# Patient Record
Sex: Male | Born: 1985 | Race: Black or African American | Hispanic: No | Marital: Single | Smoking: Current every day smoker
Health system: Southern US, Community
[De-identification: ages and names within clinical notes are randomized; demographics above are authoritative.]

## PROBLEM LIST (undated history)

## (undated) DIAGNOSIS — G43909 Migraine, unspecified, not intractable, without status migrainosus: Secondary | ICD-10-CM

## (undated) HISTORY — DX: Migraine, unspecified, not intractable, without status migrainosus: G43.909

## (undated) HISTORY — PX: OTHER SURGICAL HISTORY: SHX169

---
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
## 2006-05-11 ENCOUNTER — Emergency Department (HOSPITAL_COMMUNITY): Admission: EM | Admit: 2006-05-11 | Discharge: 2006-05-12 | Payer: Self-pay | Admitting: Emergency Medicine

## 2008-04-29 ENCOUNTER — Ambulatory Visit: Payer: Self-pay | Admitting: Nurse Practitioner

## 2008-04-29 DIAGNOSIS — R519 Headache, unspecified: Secondary | ICD-10-CM | POA: Insufficient documentation

## 2008-04-29 DIAGNOSIS — H547 Unspecified visual loss: Secondary | ICD-10-CM

## 2008-04-29 DIAGNOSIS — R51 Headache: Secondary | ICD-10-CM

## 2008-04-29 LAB — CONVERTED CEMR LAB
ALT: 12 units/L (ref 0–53)
Albumin: 4.8 g/dL (ref 3.5–5.2)
Alkaline Phosphatase: 75 units/L (ref 39–117)
BUN: 15 mg/dL (ref 6–23)
Basophils Relative: 1 % (ref 0–1)
CO2: 26 meq/L (ref 19–32)
Chloride: 104 meq/L (ref 96–112)
Eosinophils Absolute: 0.1 10*3/uL (ref 0.0–0.7)
Eosinophils Relative: 1 % (ref 0–5)
Lymphs Abs: 2.1 10*3/uL (ref 0.7–4.0)
MCHC: 35.4 g/dL (ref 30.0–36.0)
Monocytes Absolute: 0.7 10*3/uL (ref 0.1–1.0)
Monocytes Relative: 13 % — ABNORMAL HIGH (ref 3–12)
Platelets: 139 10*3/uL — ABNORMAL LOW (ref 150–400)
Potassium: 4.8 meq/L (ref 3.5–5.3)
RDW: 14 % (ref 11.5–15.5)
Sodium: 140 meq/L (ref 135–145)
TSH: 1.155 microintl units/mL (ref 0.350–4.50)
Total Bilirubin: 0.5 mg/dL (ref 0.3–1.2)
Total Protein: 7.6 g/dL (ref 6.0–8.3)
WBC: 5.6 10*3/uL (ref 4.0–10.5)

## 2008-05-07 ENCOUNTER — Ambulatory Visit (HOSPITAL_COMMUNITY): Admission: RE | Admit: 2008-05-07 | Discharge: 2008-05-07 | Payer: Self-pay | Admitting: Family Medicine

## 2008-05-07 ENCOUNTER — Encounter (INDEPENDENT_AMBULATORY_CARE_PROVIDER_SITE_OTHER): Payer: Self-pay | Admitting: Nurse Practitioner

## 2008-05-13 ENCOUNTER — Ambulatory Visit: Payer: Self-pay | Admitting: Nurse Practitioner

## 2008-05-13 LAB — CONVERTED CEMR LAB
Folate: 12.3 ng/mL
Vitamin B-12: 390 pg/mL (ref 211–911)

## 2008-05-14 ENCOUNTER — Encounter (INDEPENDENT_AMBULATORY_CARE_PROVIDER_SITE_OTHER): Payer: Self-pay | Admitting: Nurse Practitioner

## 2008-05-15 ENCOUNTER — Telehealth (INDEPENDENT_AMBULATORY_CARE_PROVIDER_SITE_OTHER): Payer: Self-pay | Admitting: Nurse Practitioner

## 2008-05-15 ENCOUNTER — Encounter (INDEPENDENT_AMBULATORY_CARE_PROVIDER_SITE_OTHER): Payer: Self-pay | Admitting: Nurse Practitioner

## 2008-05-15 DIAGNOSIS — D696 Thrombocytopenia, unspecified: Secondary | ICD-10-CM

## 2008-05-19 ENCOUNTER — Ambulatory Visit: Payer: Self-pay | Admitting: Nurse Practitioner

## 2008-05-21 ENCOUNTER — Encounter (INDEPENDENT_AMBULATORY_CARE_PROVIDER_SITE_OTHER): Payer: Self-pay | Admitting: Nurse Practitioner

## 2008-05-22 LAB — CONVERTED CEMR LAB: GGT: 24 units/L (ref 7–51)

## 2009-09-23 ENCOUNTER — Ambulatory Visit: Payer: Self-pay | Admitting: Nurse Practitioner

## 2009-09-23 DIAGNOSIS — L42 Pityriasis rosea: Secondary | ICD-10-CM | POA: Insufficient documentation

## 2010-01-15 ENCOUNTER — Emergency Department (HOSPITAL_COMMUNITY): Admission: EM | Admit: 2010-01-15 | Discharge: 2010-01-15 | Payer: Self-pay | Admitting: Emergency Medicine

## 2010-01-19 ENCOUNTER — Emergency Department (HOSPITAL_COMMUNITY): Admission: EM | Admit: 2010-01-19 | Discharge: 2010-01-19 | Payer: Self-pay | Admitting: Family Medicine

## 2010-06-10 ENCOUNTER — Emergency Department (HOSPITAL_COMMUNITY): Admission: EM | Admit: 2010-06-10 | Discharge: 2010-06-10 | Payer: Self-pay | Admitting: Family Medicine

## 2010-06-10 ENCOUNTER — Emergency Department (HOSPITAL_COMMUNITY): Admission: EM | Admit: 2010-06-10 | Discharge: 2010-06-10 | Payer: Self-pay | Admitting: Emergency Medicine

## 2010-08-31 ENCOUNTER — Emergency Department (HOSPITAL_COMMUNITY): Admission: EM | Admit: 2010-08-31 | Discharge: 2010-08-31 | Payer: Self-pay | Admitting: Family Medicine

## 2010-09-07 ENCOUNTER — Emergency Department (HOSPITAL_COMMUNITY): Admission: EM | Admit: 2010-09-07 | Discharge: 2010-09-07 | Payer: Self-pay | Admitting: Emergency Medicine

## 2010-11-15 ENCOUNTER — Encounter: Payer: Self-pay | Admitting: Family Medicine

## 2011-01-07 LAB — POCT I-STAT, CHEM 8
BUN: 14 mg/dL (ref 6–23)
Calcium, Ion: 1.15 mmol/L (ref 1.12–1.32)
Chloride: 101 mEq/L (ref 96–112)
Creatinine, Ser: 1.1 mg/dL (ref 0.4–1.5)
Glucose, Bld: 93 mg/dL (ref 70–99)
HCT: 52 % (ref 39.0–52.0)
Hemoglobin: 17.7 g/dL — ABNORMAL HIGH (ref 13.0–17.0)
Sodium: 137 mEq/L (ref 135–145)

## 2011-01-07 LAB — COMPREHENSIVE METABOLIC PANEL
AST: 31 U/L (ref 0–37)
Alkaline Phosphatase: 77 U/L (ref 39–117)
CO2: 25 mEq/L (ref 19–32)
Creatinine, Ser: 0.85 mg/dL (ref 0.4–1.5)
GFR calc Af Amer: >60 mL/min (ref >60)
GFR calc non Af Amer: >60 mL/min (ref >60)
Total Bilirubin: 1.1 mg/dL (ref 0.3–1.2)

## 2011-01-07 LAB — POCT CARDIAC MARKERS
CKMB, poc: 1.1 ng/mL (ref 1.0–8.0)
CKMB, poc: <1 ng/mL — ABNORMAL LOW (ref 1.0–8.0)
Myoglobin, poc: 52.6 ng/mL (ref 12–200)
Troponin i, poc: <0.05 ng/mL (ref 0.00–0.09)

## 2011-01-07 LAB — URINALYSIS, ROUTINE W REFLEX MICROSCOPIC
Bilirubin Urine: NEGATIVE
Nitrite: NEGATIVE
Protein, ur: NEGATIVE mg/dL
Specific Gravity, Urine: 1.019 (ref 1.005–1.030)

## 2011-01-07 LAB — CBC: WBC: 5.2 10*3/uL (ref 4.0–10.5)

## 2011-01-07 LAB — RAPID URINE DRUG SCREEN, HOSP PERFORMED
Amphetamines: NOT DETECTED
Barbiturates: NOT DETECTED
Cocaine: NOT DETECTED

## 2012-05-21 ENCOUNTER — Emergency Department (HOSPITAL_BASED_OUTPATIENT_CLINIC_OR_DEPARTMENT_OTHER)
Admission: EM | Admit: 2012-05-21 | Discharge: 2012-05-22 | Disposition: A | Discharge status: Home / Self Care | Payer: Self-pay | Attending: Emergency Medicine | Admitting: Emergency Medicine

## 2012-05-21 ENCOUNTER — Encounter (HOSPITAL_BASED_OUTPATIENT_CLINIC_OR_DEPARTMENT_OTHER): Payer: Self-pay | Admitting: Emergency Medicine

## 2012-05-21 DIAGNOSIS — H6690 Otitis media, unspecified, unspecified ear: Secondary | ICD-10-CM | POA: Insufficient documentation

## 2012-05-21 DIAGNOSIS — F17200 Nicotine dependence, unspecified, uncomplicated: Secondary | ICD-10-CM | POA: Insufficient documentation

## 2012-05-21 MED ORDER — AMOXICILLIN 500 MG PO CAPS: 500.0000 mg | ORAL_CAPSULE | Freq: Three times a day (TID) | ORAL | Status: AC

## 2012-05-21 MED ORDER — IBUPROFEN 800 MG PO TABS
800.0000 mg | ORAL_TABLET | Freq: Once | ORAL | Status: AC
  Administered 2012-05-21: 800 mg via ORAL
  Filled 2012-05-21: qty 1

## 2012-05-21 MED ORDER — IBUPROFEN 800 MG PO TABS: 800.0000 mg | ORAL_TABLET | Freq: Three times a day (TID) | ORAL | Status: AC

## 2012-05-21 NOTE — ED Notes (Signed)
Pt c/o nasal congestion, right ear "stopped up", knee pain bilaterally, cough

## 2012-05-21 NOTE — ED Notes (Signed)
PA at bedside.

## 2012-05-21 NOTE — ED Notes (Signed)
Pt denies taking any OTC decongestant or pain reliever.

## 2012-05-21 NOTE — ED Provider Notes (Signed)
History     CSN: [id number]  Arrival date & time 05/21/12  2320   First MD Initiated Contact with Patient 05/21/12 2333      Chief Complaint  Patient presents with  . Otalgia  . Sore Throat  . Knee Pain  . Cough    (Consider location/radiation/quality/duration/timing/severity/associated sxs/prior treatment) Patient is a 26 y.o. male presenting with ear pain, pharyngitis, knee pain, and cough. The history is provided by the patient.  Otalgia This is a new problem. There is pain in the left ear. The problem occurs constantly. The problem has been gradually worsening. There has been no fever. The pain is at a severity of 5/10. The pain is moderate. Associated symptoms include rhinorrhea, sore throat and cough. Pertinent negatives include no ear discharge. His past medical history does not include hearing loss.  Sore Throat This is a new problem. The current episode started today. Associated symptoms include coughing and a sore throat. The treatment provided moderate relief.  Knee Pain Associated symptoms include coughing and a sore throat.  Cough Associated symptoms include ear pain, rhinorrhea and sore throat.    History reviewed. No pertinent past medical history.  Past Surgical History  Procedure Date  . Skull fracture repair     No family history on file.  History  Substance Use Topics  . Smoking status: Current Everyday Smoker  . Smokeless tobacco: Not on file  . Alcohol Use: Yes      Review of Systems  HENT: Positive for ear pain, sore throat and rhinorrhea. Negative for ear discharge.   Respiratory: Positive for cough.   All other systems reviewed and are negative.    Allergies  Review of patient's allergies indicates no known allergies.  Home Medications  No current outpatient prescriptions on file.  BP 126/68  Pulse 86  Temp 99.5 F (37.5 C) (Oral)  Resp 18  Ht 6\' 1"  (1.854 m)  Wt 185 lb (83.915 kg)  BMI 24.41 kg/m2  SpO2 99%  Physical Exam    Nursing note and vitals reviewed. Constitutional: He is oriented to person, place, and time. He appears well-developed and well-nourished.  HENT:  Head: Normocephalic.       bilat tm's erythematous and bulging, throat erythematous  Eyes: Conjunctivae and EOM are normal. Pupils are equal, round, and reactive to light.  Neck: Normal range of motion. Neck supple.  Cardiovascular: Normal rate and normal heart sounds.   Pulmonary/Chest: Effort normal and breath sounds normal.  Abdominal: Soft.  Musculoskeletal: Normal range of motion.  Neurological: He is alert and oriented to person, place, and time. He has normal reflexes.  Skin: Skin is warm.  Psychiatric: He has a normal mood and affect.    ED Course  Procedures (including critical care time)  Labs Reviewed - No data to display No results found.   1. Otitis media       MDM  Ibuprofen 800 tid x 3 days.  amoxicillian tid x 10 days        Lonia Skinner [place], [place] 05/21/12 2342  Lonia Skinner [place], [place] 05/21/12 [phone]

## 2012-05-22 NOTE — ED Provider Notes (Signed)
Medical screening examination/treatment/procedure(s) were performed by non-physician practitioner and as supervising physician I was immediately available for consultation/collaboration.   Gerhard Munch, MD 05/22/12 [phone]

## 2013-03-11 ENCOUNTER — Emergency Department (HOSPITAL_BASED_OUTPATIENT_CLINIC_OR_DEPARTMENT_OTHER)
Admission: EM | Admit: 2013-03-11 | Discharge: 2013-03-11 | Disposition: A | Discharge status: Home / Self Care | Payer: Self-pay | Attending: Emergency Medicine | Admitting: Emergency Medicine

## 2013-03-11 ENCOUNTER — Encounter (HOSPITAL_BASED_OUTPATIENT_CLINIC_OR_DEPARTMENT_OTHER): Payer: Self-pay | Admitting: Family Medicine

## 2013-03-11 DIAGNOSIS — R05 Cough: Secondary | ICD-10-CM | POA: Insufficient documentation

## 2013-03-11 DIAGNOSIS — Z8781 Personal history of (healed) traumatic fracture: Secondary | ICD-10-CM | POA: Insufficient documentation

## 2013-03-11 DIAGNOSIS — H9190 Unspecified hearing loss, unspecified ear: Secondary | ICD-10-CM | POA: Insufficient documentation

## 2013-03-11 DIAGNOSIS — F17200 Nicotine dependence, unspecified, uncomplicated: Secondary | ICD-10-CM | POA: Insufficient documentation

## 2013-03-11 DIAGNOSIS — R059 Cough, unspecified: Secondary | ICD-10-CM | POA: Insufficient documentation

## 2013-03-11 DIAGNOSIS — H6690 Otitis media, unspecified, unspecified ear: Secondary | ICD-10-CM | POA: Insufficient documentation

## 2013-03-11 DIAGNOSIS — J3489 Other specified disorders of nose and nasal sinuses: Secondary | ICD-10-CM | POA: Insufficient documentation

## 2013-03-11 DIAGNOSIS — H6692 Otitis media, unspecified, left ear: Secondary | ICD-10-CM

## 2013-03-11 MED ORDER — ANTIPYRINE-BENZOCAINE 5.4-1.4 % OT SOLN
3.0000 [drp] | Freq: Once | OTIC | Status: AC
  Administered 2013-03-11: 4 [drp] via OTIC
  Filled 2013-03-11: qty 10

## 2013-03-11 MED ORDER — AMOXICILLIN 500 MG PO CAPS: 500.0000 mg | ORAL_CAPSULE | Freq: Three times a day (TID) | ORAL | Status: DC

## 2013-03-11 NOTE — ED Notes (Signed)
Pt c/o left ear pain x 2 days  

## 2013-03-11 NOTE — ED Provider Notes (Signed)
History     CSN: [id number]  Arrival date & time 03/11/13  1441   First MD Initiated Contact with Patient 03/11/13 1459      Chief Complaint  Patient presents with  . Otalgia    (Consider location/radiation/quality/duration/timing/severity/associated sxs/prior treatment) Patient is a 27 y.o. male presenting with ear pain. The history is provided by the patient and a significant other. No language interpreter was used.  Otalgia Location:  Left Behind ear:  No abnormality Quality:  Aching Severity:  Moderate Onset quality:  Gradual Duration:  3 days Timing:  Constant Progression:  Worsening Chronicity:  New Context: not direct blow, not elevation change, not foreign body in ear, not loud noise and no water in ear   Relieved by:  None tried Worsened by:  Nothing tried Ineffective treatments:  None tried Associated symptoms: congestion, cough and rhinorrhea   Associated symptoms: no abdominal pain, no diarrhea, no ear discharge, no fever, no headaches, no hearing loss, no neck pain, no rash, no sore throat, no tinnitus and no vomiting   Risk factors: no recent travel, no chronic ear infection and no prior ear surgery     Benjamin Vargas is a 27 y.o. male  With no major medical Hx presents to the Emergency Department complaining of gradual, persistent, progressively worsening L ear pain beginning 3 days ago and gradually worsening with associated decrease in hearing.  Pt denies using foreign object to scratch his ear.  Pt has had associated dry cough for several days but denies fever, chills, headache, neck pain, neck stiffness, chest pain, shortness of breath, abdominal pain, nausea vomiting, diarrhea, weakness, dizziness. Nothing makes ear pain better or worse.  He denies sick contacts.    History reviewed. No pertinent past medical history.  Past Surgical History  Procedure Laterality Date  . Skull fracture repair      No family history on file.  History  Substance Use  Topics  . Smoking status: Current Every Day Smoker  . Smokeless tobacco: Not on file  . Alcohol Use: Yes      Review of Systems  Constitutional: Negative for fever, chills, appetite change and fatigue.  HENT: Positive for ear pain, congestion and rhinorrhea. Negative for hearing loss, sore throat, mouth sores, neck pain, neck stiffness, postnasal drip, sinus pressure, tinnitus and ear discharge.   Eyes: Negative for visual disturbance.  Respiratory: Positive for cough. Negative for chest tightness, shortness of breath, wheezing and stridor.   Cardiovascular: Negative for chest pain, palpitations and leg swelling.  Gastrointestinal: Negative for nausea, vomiting, abdominal pain and diarrhea.  Genitourinary: Negative for dysuria, urgency, frequency and hematuria.  Musculoskeletal: Negative for myalgias, back pain and arthralgias.  Skin: Negative for rash.  Neurological: Negative for syncope, light-headedness, numbness and headaches.  Hematological: Negative for adenopathy.  Psychiatric/Behavioral: The patient is not nervous/anxious.   All other systems reviewed and are negative.    Allergies  Review of patient's allergies indicates no known allergies.  Home Medications   Current Outpatient Rx  Name  Route  Sig  Dispense  Refill  . amoxicillin (AMOXIL) 500 MG capsule   Oral   Take 1 capsule (500 mg total) by mouth 3 (three) times daily.   21 capsule   0     BP 130/84  Pulse 82  Temp(Src) 98.3 F (36.8 C) (Oral)  Resp 16  Ht 6\' 1"  (1.854 m)  Wt 185 lb (83.915 kg)  BMI 24.41 kg/m2  SpO2 98%  Physical Exam  Constitutional: He is oriented to person, place, and time. He appears well-developed and well-nourished. No distress.  HENT:  Head: Normocephalic and atraumatic.  Right Ear: Tympanic membrane, external ear and ear canal normal.  Left Ear: External ear normal. Tympanic membrane is erythematous and bulging. A middle ear effusion is present.  Nose: Mucosal edema and  rhinorrhea present. No epistaxis. Right sinus exhibits no maxillary sinus tenderness and no frontal sinus tenderness. Left sinus exhibits no maxillary sinus tenderness and no frontal sinus tenderness.  Mouth/Throat: Uvula is midline, oropharynx is clear and moist and mucous membranes are normal. Mucous membranes are not pale and not cyanotic. No edematous. No oropharyngeal exudate, posterior oropharyngeal edema, posterior oropharyngeal erythema or tonsillar abscesses.  Eyes: Conjunctivae are normal. Pupils are equal, round, and reactive to light.  Neck: Normal range of motion and full passive range of motion without pain.  Cardiovascular: Normal rate, normal heart sounds and intact distal pulses.   No murmur heard. Pulmonary/Chest: Effort normal and breath sounds normal. No stridor. No respiratory distress. He has no wheezes. He has no rales. He exhibits no tenderness.  Abdominal: Soft. Bowel sounds are normal. There is no tenderness.  Musculoskeletal: Normal range of motion.  Lymphadenopathy:    He has no cervical adenopathy.  Neurological: He is alert and oriented to person, place, and time. He exhibits normal muscle tone. Coordination normal.  Skin: Skin is dry. No rash noted. He is not diaphoretic. No erythema.  Psychiatric: He has a normal mood and affect.    ED Course  Procedures (including critical care time)  Labs Reviewed - No data to display No results found.   1. Otitis media, left       MDM  [name] presents with otalgia and exam consistent with acute otitis media. No concern for acute mastoiditis, meningitis. No antibiotic use in the last month.  Patient discharged home with Amoxicillin.  Auraglan given in the department,  Advised patient to follow-up with PCP this week.  I have also discussed reasons to return immediately to the ER.  Patient expresses understanding and agrees with plan.         Dahlia Client Paddy Walthall, PA-C 03/11/13 1520

## 2013-03-11 NOTE — ED Notes (Signed)
pa at bedside. 

## 2013-03-12 NOTE — ED Provider Notes (Signed)
Medical screening examination/treatment/procedure(s) were performed by non-physician practitioner and as supervising physician I was immediately available for consultation/collaboration.   Parmvir Boomer III, MD 03/12/13 1438 

## 2014-06-17 ENCOUNTER — Encounter (HOSPITAL_COMMUNITY): Payer: Self-pay | Admitting: Emergency Medicine

## 2014-06-17 ENCOUNTER — Emergency Department (HOSPITAL_COMMUNITY)
Admission: EM | Admit: 2014-06-17 | Discharge: 2014-06-17 | Disposition: A | Discharge status: Home / Self Care | Payer: Self-pay | Attending: Family Medicine | Admitting: Family Medicine

## 2014-06-17 DIAGNOSIS — L089 Local infection of the skin and subcutaneous tissue, unspecified: Secondary | ICD-10-CM | POA: Insufficient documentation

## 2014-06-17 DIAGNOSIS — F17200 Nicotine dependence, unspecified, uncomplicated: Secondary | ICD-10-CM | POA: Insufficient documentation

## 2014-06-17 MED ORDER — CLINDAMYCIN HCL 300 MG PO CAPS: 300.0000 mg | ORAL_CAPSULE | Freq: Three times a day (TID) | ORAL | Status: DC

## 2014-06-17 NOTE — ED Notes (Signed)
Pt also c/o some numbness in left big toe since Saturday, pt started a new job where he wears boots that he ties really tight and the numbness started shortly after wearing the new boots.

## 2014-06-17 NOTE — Discharge Instructions (Signed)
Your skin became infected by either an insect bite or by bacteria getting into a hair follicle Please start the antibiotics and them for the full 10 days Apply warm compresses to help speed up the healing process and apply ointment as needed to keep the are moist.  Please come back if you do not get better.  Folliculitis  Folliculitis is redness, soreness, and swelling (inflammation) of the hair follicles. This condition can occur anywhere on the body. People with weakened immune systems, diabetes, or obesity have a greater risk of getting folliculitis. CAUSES  Bacterial infection. This is the most common cause.  Fungal infection.  Viral infection.  Contact with certain chemicals, especially oils and tars. Long-term folliculitis can result from bacteria that live in the nostrils. The bacteria may trigger multiple outbreaks of folliculitis over time. SYMPTOMS Folliculitis most commonly occurs on the scalp, thighs, legs, back, buttocks, and areas where hair is shaved frequently. An early sign of folliculitis is a small, white or yellow, pus-filled, itchy lesion (pustule). These lesions appear on a red, inflamed follicle. They are usually less than 0.2 inches (5 mm) wide. When there is an infection of the follicle that goes deeper, it becomes a boil or furuncle. A group of closely packed boils creates a larger lesion (carbuncle). Carbuncles tend to occur in hairy, sweaty areas of the body. DIAGNOSIS  Your caregiver can usually tell what is wrong by doing a physical exam. A sample may be taken from one of the lesions and tested in a lab. This can help determine what is causing your folliculitis. TREATMENT  Treatment may include:  Applying warm compresses to the affected areas.  Taking antibiotic medicines orally or applying them to the skin.  Draining the lesions if they contain a large amount of pus or fluid.  Laser hair removal for cases of long-lasting folliculitis. This helps to prevent  regrowth of the hair. HOME CARE INSTRUCTIONS  Apply warm compresses to the affected areas as directed by your caregiver.  If antibiotics are prescribed, take them as directed. Finish them even if you start to feel better.  You may take over-the-counter medicines to relieve itching.  Do not shave irritated skin.  Follow up with your caregiver as directed. SEEK IMMEDIATE MEDICAL CARE IF:   You have increasing redness, swelling, or pain in the affected area.  You have a fever. MAKE SURE YOU:  Understand these instructions.  Will watch your condition.  Will get help right away if you are not doing well or get worse. Document Released: 12/19/2001 Document Revised: 04/10/2012 Document Reviewed: 01/10/2012 [facility] Patient Information 2015 [place], [place]. This information is not intended to replace advice given to you by your health care provider. Make sure you discuss any questions you have with your health care provider.

## 2014-06-17 NOTE — ED Notes (Addendum)
Pt in c/o possible spider bite to left lower leg that happened last week, states since Saturday he has noted tingling and pain to his great toe, no swelling or redness noted to leg, normal CMS noted to toes with cap refill <3 seconds, normal movement noted

## 2014-06-17 NOTE — ED Provider Notes (Signed)
CSN: [id number]     Arrival date & time 06/17/14  1721 History   First MD Initiated Contact with Patient 06/17/14 1950     Chief Complaint  Patient presents with  . Insect Bite     (Consider location/radiation/quality/duration/timing/severity/associated sxs/prior Treatment) HPI  L leg w/ 2 possible iknsect bites. Occurred 7 days ago. Initially itchy. Started swell and become painful. White discharge last Thu when started to squeeze the spots. Now w/ open sores on legs. Nothing makes them worse. Denies fevers, rash, joint pain and effusions, CP, SOB.     History reviewed. No pertinent past medical history. Past Surgical History  Procedure Laterality Date  . Skull fracture repair     History reviewed. No pertinent family history. History  Substance Use Topics  . Smoking status: Current Every Day Smoker  . Smokeless tobacco: Not on file  . Alcohol Use: Yes    Review of Systems Per HPI with all other pertinent systems negative.     Allergies  Review of patient's allergies indicates no known allergies.  Home Medications   Prior to Admission medications   Medication Sig Start Date End Date Taking? Authorizing Provider  clindamycin (CLEOCIN) 300 MG capsule Take 1 capsule (300 mg total) by mouth 3 (three) times daily. 06/17/14   Ozella Rocks, MD   BP 130/74  Pulse 94  Temp(Src) 99.2 F (37.3 C) (Oral)  Resp 20  Wt 185 lb (83.915 kg)  SpO2 100% Physical Exam  Constitutional: He is oriented to person, place, and time. He appears well-developed and well-nourished. No distress.  HENT:  Head: Normocephalic and atraumatic.  Eyes: EOM are normal. Pupils are equal, round, and reactive to light.  Neck: Normal range of motion.  Pulmonary/Chest: Effort normal. No respiratory distress.  Abdominal: He exhibits no distension.  Musculoskeletal: Normal range of motion. He exhibits no edema and no tenderness.  Neurological: He is alert and oriented to person, place, and time. No  cranial nerve deficit. He exhibits normal muscle tone.  Skin: Skin is warm and dry.  L leg w/ 2 lesions. Lateral proximal leg w/ central scab but surounding area of 1.5cm induration w/o fluctuance. ttp R medial mid calf lesion that is open and minimally weaping w/ mild tenderness to palpation  Psychiatric: He has a normal mood and affect. His behavior is normal. Judgment and thought content normal.    ED Course  Procedures (including critical care time) Labs Review Labs Reviewed - No data to display  Imaging Review No results found.   EKG Interpretation None      MDM   Final diagnoses:  Skin infection   Initial agressiveness and protracted course of infection concerning for MRSA infection - start clinda - WCX sent Precautions given and all questions answered  Shelly Flatten, MD Family Medicine 06/17/2014, 8:03 PM      Ozella Rocks, MD 06/17/14 2004

## 2014-06-20 LAB — WOUND CULTURE
CULTURE: NO GROWTH
GRAM STAIN: NONE SEEN

## 2021-05-25 ENCOUNTER — Ambulatory Visit (INDEPENDENT_AMBULATORY_CARE_PROVIDER_SITE_OTHER): Payer: Self-pay

## 2021-05-25 ENCOUNTER — Ambulatory Visit (HOSPITAL_COMMUNITY)
Admission: EM | Admit: 2021-05-25 | Discharge: 2021-05-25 | Disposition: A | Discharge status: Home / Self Care | Payer: Self-pay | Attending: Emergency Medicine | Admitting: Emergency Medicine

## 2021-05-25 ENCOUNTER — Encounter (HOSPITAL_COMMUNITY): Payer: Self-pay | Admitting: Emergency Medicine

## 2021-05-25 DIAGNOSIS — R079 Chest pain, unspecified: Secondary | ICD-10-CM

## 2021-05-25 DIAGNOSIS — R0789 Other chest pain: Secondary | ICD-10-CM

## 2021-05-25 MED ORDER — NAPROXEN 500 MG PO TABS: 500.0000 mg | ORAL_TABLET | Freq: Two times a day (BID) | ORAL | 0 refills | Status: DC

## 2021-05-25 MED ORDER — PREDNISONE 50 MG PO TABS: 50.0000 mg | ORAL_TABLET | Freq: Every day | ORAL | 0 refills | Status: DC

## 2021-05-25 NOTE — ED Triage Notes (Signed)
Pt presents with Chest Pain under left nipple Xs 2-3 weeks. States only hurts with palpitation or coughing. Pt also complaining of left groin pain xs 3 days. States has started feeling better after rest and muscle rub.

## 2021-05-25 NOTE — ED Provider Notes (Signed)
HPI  SUBJECTIVE:  Benjamin Vargas is a 35 y.o. male who presents with 2 to 3 weeks of left-sided chest pain located at the base of his ribs.  It is present most days.  Describes it as sharp, sore, lasting seconds, present only with palpation and coughing.  He has been coughing quite a bit recently.  No change in his physical activity, recent heavy lifting, trauma to the chest, radiation of this pain up his neck, down his arm, through to his back, wheezing, shortness of breath, exertional or positional component.  No nausea, diaphoresis, abdominal pain, unintentional weight loss, calf pain, swelling, hemoptysis, surgery in the past 4 weeks, recent immobilization.  No fevers.  He has never had symptoms like this before.  He has not tried anything for this.  No alleviating factors.  Symptoms are worse with palpation, or movement and coughing.  He is a smoker.  No history of cancer, PE, DVT, MI, coronary artery disease, hypercholesterolemia, diabetes, hypertension, CVA.  No exogenous estrogen.  Family history negative for MI.  PMD: None.   History reviewed. No pertinent past medical history.  Past Surgical History:  Procedure Laterality Date   skull fracture repair      History reviewed. No pertinent family history.  Social History   Tobacco Use   Smoking status: Every Day   Smokeless tobacco: Never  Substance Use Topics   Alcohol use: Yes   Drug use: No    No current facility-administered medications for this encounter.  Current Outpatient Medications:    naproxen (NAPROSYN) 500 MG tablet, Take 1 tablet (500 mg total) by mouth 2 (two) times daily., Disp: 30 tablet, Rfl: 0   predniSONE (DELTASONE) 50 MG tablet, Take 1 tablet (50 mg total) by mouth daily with breakfast., Disp: 5 tablet, Rfl: 0  No Known Allergies   ROS  As noted in HPI.   Physical Exam  BP (!) 143/107 (BP Location: Left Arm)   Pulse (!) 104   Temp 98.9 F (37.2 C) (Oral)   Resp 17   SpO2 99%   Constitutional:  Well developed, well nourished, no acute distress Eyes:  EOMI, conjunctiva normal bilaterally HENT: Normocephalic, atraumatic,mucus membranes moist Respiratory: Normal inspiratory effort, lungs clear bilaterally. Cardiovascular: Mild regular tachycardia, no murmurs rubs or gallops.  Positive tenderness along the left lower ribs along the costochondral junctions. GI: nondistended, soft, nontender, no pulsatile palpable abdominal mass. skin: No rash in the area of pain, skin intact Musculoskeletal: Calves symmetric, nontender, no edema. Neurologic: Alert & oriented x 3, no focal neuro deficits Psychiatric: Speech and behavior appropriate   ED Course   Medications - No data to display  Orders Placed This Encounter  Procedures   DG Chest 2 View    Standing Status:   Standing    Number of Occurrences:   1    Order Specific Question:   Reason for Exam (SYMPTOM  OR DIAGNOSIS REQUIRED)    Answer:   Left lower chest pain rule out pneumothorax, pneumonia, pleural effusion   ED EKG    Standing Status:   Standing    Number of Occurrences:   1    Order Specific Question:   Reason for Exam    Answer:   Chest Pain    No results found for this or any previous visit (from the past 24 hour(s)). DG Chest 2 View  Result Date: 05/25/2021 CLINICAL DATA:  Insert chest EXAM: CHEST - 2 VIEW COMPARISON:  06/10/2010 FINDINGS: The heart  size and mediastinal contours are within normal limits.No focal airspace disease. No pleural effusion or pneumothorax.No acute osseous abnormality. IMPRESSION: No evidence of acute cardiopulmonary disease. Electronically Signed   By: Caprice Renshaw   On: 05/25/2021 15:03    ED Clinical Impression  1. Musculoskeletal chest pain      ED Assessment/Plan  EKG: Normal sinus rhythm, rate 90.  Right axis deviation.  Right atrial hypertrophy.  No ST elevation.  No previous EKG for comparison.  Patient does not meet PERC criteria due to tachycardia, however, Wells score is 1.5,  so I feel that he is at low risk for PE.  ACS also low in the differential given reproducibility of chest pain and normal EKG.  Doubt aortic dissection.  Will check x-ray to rule out other catastrophic causes of chest pain, however, if normal, will treat as musculoskeletal with Naprosyn/Tylenol, prednisone for 5 days, will provide a primary care list and order assistance in finding a PMD.  Strict ER return precautions given.  Reviewed imaging independently.  Normal chest x-ray.  See radiology report for full details.  Normal chest x-ray, reassuring EKG.  Will treat as musculoskeletal chest pain/costochondritis.  Plan as above.  Discussed imaging, MDM, treatment plan, and plan for follow-up with patient. Discussed sn/sx that should prompt return to the ED. patient agrees with plan.   Meds ordered this encounter  Medications   naproxen (NAPROSYN) 500 MG tablet    Sig: Take 1 tablet (500 mg total) by mouth 2 (two) times daily.    Dispense:  30 tablet    Refill:  0   predniSONE (DELTASONE) 50 MG tablet    Sig: Take 1 tablet (50 mg total) by mouth daily with breakfast.    Dispense:  5 tablet    Refill:  0       *This clinic note was created using Scientist, clinical (histocompatibility and immunogenetics). Therefore, there may be occasional mistakes despite careful proofreading.  ?    Domenick Gong, MD 05/28/21 [phone]

## 2021-05-25 NOTE — Discharge Instructions (Addendum)
Your EKG, chest x-ray were both reassuring.  Take 500 mg of Naprosyn with 1000 mg of Tylenol twice a day.  May take an additional dose milligrams Tylenol up to 2 more times a day for total 4000 mg of Tylenol.  Do not exceed 4000 mg of Tylenol within 24 hours.  Finish prednisone, unless a healthcare provider tells you to stop.  Follow-up with a primary care provider of your choice.  See list below.  Below is a list of primary care practices who are taking new patients for you to follow-up with.  [facility] internal medicine clinic Ground Floor - [facility], [address] [place], [place], [place] [address] [phone]  [facility] at [facility] [address] [place], [place] [address] [phone]  [facility] [address] [place], [place] [address] [phone]  [facility] [phone] [address] [place] [address]  [facility]: [address] [place] [address]  [phone]  [facility]: [address] [place] [address]  [phone]  [facility] : [address] [place] [address] [phone]  [facility]: [address] [place] [address] [phone]  [facility]: [address] [place] [address] [phone]  Dr. Oneal Grout [address] [facility] [place] [address]  [phone]  Go to www.goodrx.com  or www.costplusdrugs.com to look up your medications. This will give you a list of where you can find your prescriptions at the most affordable prices. Or ask the pharmacist what the cash price is, or if they have any other discount programs available to help make your medication more affordable. This can be less expensive than what you would pay  with insurance.

## 2021-07-12 ENCOUNTER — Encounter (HOSPITAL_COMMUNITY): Payer: Self-pay | Admitting: Emergency Medicine

## 2021-07-12 ENCOUNTER — Other Ambulatory Visit: Payer: Self-pay

## 2021-07-12 ENCOUNTER — Ambulatory Visit (HOSPITAL_COMMUNITY)
Admission: EM | Admit: 2021-07-12 | Discharge: 2021-07-12 | Disposition: A | Discharge status: Home / Self Care | Payer: Self-pay | Attending: Emergency Medicine | Admitting: Emergency Medicine

## 2021-07-12 DIAGNOSIS — U071 COVID-19: Secondary | ICD-10-CM | POA: Insufficient documentation

## 2021-07-12 LAB — SARS CORONAVIRUS 2 (TAT 6-24 HRS): SARS Coronavirus 2: POSITIVE — AB

## 2021-07-12 NOTE — ED Provider Notes (Signed)
[facility]    CSN: [id number] Arrival date & time: 07/12/21  5366      History   Chief Complaint Chief Complaint  Patient presents with   Diarrhea   Cough    HPI Benjamin Vargas is a 35 y.o. male.   Patient presents with intermittent generalized headache, nonproductive cough, nasal congestion, rhinorrhea, generalized abdominal cramping which has resolved, diarrhea described as soft for 2 days.  Home COVID test positive, everyone in patient's home is positive for COVID.  Job requiring testing from facility with a letter.  Denies fever, chills, body aches, ear pain or fullness, shortness of breath, wheezing, chest pain or tightness, nausea, vomiting.  Using over-the-counter medications for symptom management.  History reviewed. No pertinent past medical history.  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 Authorizing Provider  naproxen (NAPROSYN) 500 MG tablet Take 1 tablet (500 mg total) by mouth 2 (two) times daily. 05/25/21   Domenick Gong, MD  predniSONE (DELTASONE) 50 MG tablet Take 1 tablet (50 mg total) by mouth daily with breakfast. 05/25/21   Domenick Gong, MD    Family History No family history on file.  Social History Social History   Tobacco Use   Smoking status: Every Day   Smokeless tobacco: Never  Substance Use Topics   Alcohol use: Yes   Drug use: No     Allergies   Patient has no known allergies.   Review of Systems Review of Systems Defer to HPI    Physical Exam Triage Vital Signs ED Triage Vitals  Enc Vitals Group     BP 07/12/21 0855 (!) 158/102     Pulse Rate 07/12/21 0855 90     Resp 07/12/21 0855 17      Temp 07/12/21 0855 99 F (37.2 C)     Temp Source 07/12/21 0855 Oral     SpO2 07/12/21 0855 97 %     Weight --      Height --      Head Circumference --      Peak Flow --      Pain Score 07/12/21 0854 7     Pain Loc --      Pain Edu? --      Excl. in GC? --    No data found.  Updated Vital Signs BP (!) 158/102 (BP Location: Right Arm)   Pulse 90   Temp 99 F (37.2 C) (Oral)   Resp 17   SpO2 97%   Visual Acuity Right Eye Distance:   Left Eye Distance:   Bilateral Distance:    Right Eye Near:   Left Eye Near:    Bilateral Near:     Physical Exam Constitutional:      Appearance: Normal appearance. He is normal weight.  HENT:     Head: Normocephalic.     Right Ear: Tympanic membrane, ear canal and external ear normal.     Left Ear: Tympanic membrane, ear canal and external ear normal.     Nose: Congestion and rhinorrhea present.     Mouth/Throat:     Mouth: Mucous membranes are moist.  Pharynx: Posterior oropharyngeal erythema present.  Eyes:     Extraocular Movements: Extraocular movements intact.  Cardiovascular:     Rate and Rhythm: Normal rate and regular rhythm.     Pulses: Normal pulses.     Heart sounds: Normal heart sounds.  Pulmonary:     Effort: Pulmonary effort is normal.     Breath sounds: Normal breath sounds.  Abdominal:     General: Abdomen is flat. Bowel sounds are normal.     Palpations: Abdomen is soft.  Musculoskeletal:     Cervical back: Normal range of motion and neck supple.  Skin:    General: Skin is warm and dry.  Neurological:     Mental Status: He is alert and oriented to person, place, and time. Mental status is at baseline.  Psychiatric:        Mood and Affect: Mood normal.        Behavior: Behavior normal.     UC Treatments / Results  Labs (all labs ordered are listed, but only abnormal results are displayed) Labs Reviewed  SARS CORONAVIRUS 2 (TAT 6-24 HRS)    EKG   Radiology No results  found.  Procedures Procedures (including critical care time)  Medications Ordered in UC Medications - No data to display  Initial Impression / Assessment and Plan / UC Course  I have reviewed the triage vital signs and the nursing notes.  Pertinent labs & imaging results that were available during my care of the patient were reviewed by me and considered in my medical decision making (see chart for details).  COVID-19  1.  COVID test pending 2.  Given work note for quarantine to return on 07/16/2021 3.  Over-the-counter medications for symptom management Final Clinical Impressions(s) / UC Diagnoses   Final diagnoses:  COVID-19     Discharge Instructions      We will contact you if your COVID test is positive.  Please quarantine while you wait for the results.  If your test is negative you may resume normal activities.  If your test is positive please continue to quarantine for at least 5 days from your symptom onset or until you are without a fever for at least 24 hours after the medications.    You can take Tylenol and/or Ibuprofen as needed for fever reduction and pain relief.   For cough: honey 1/2 to 1 teaspoon (you can dilute the honey in water or another fluid).  You can also use guaifenesin and dextromethorphan for cough. You can use a humidifier for chest congestion and cough.  If you don't have a humidifier, you can sit in the bathroom with the hot shower running.      For sore throat: try warm salt water gargles, cepacol lozenges, throat spray, warm tea or water with lemon/honey, popsicles or ice, or OTC cold relief medicine for throat discomfort.   For congestion: take a daily anti-histamine like Zyrtec, Claritin, and a oral decongestant, such as pseudoephedrine.  You can also use Flonase 1-2 sprays in each nostril daily.   It is important to stay hydrated: drink plenty of fluids (water, gatorade/powerade/pedialyte, juices, or teas) to keep your throat moisturized and  help further relieve irritation/discomfort.     ED Prescriptions   None    PDMP not reviewed this encounter.   Valinda Hoar, [place] 07/12/21 [phone]

## 2021-07-12 NOTE — ED Triage Notes (Signed)
Pt reports had headache, cough, congestion, diarrhea for two days. He and his family did home covid test and everyone is positive. Pt reports his work needs proof or letter.

## 2021-07-12 NOTE — Discharge Instructions (Signed)
We will contact you if your COVID test is positive.  Please quarantine while you wait for the results.  If your test is negative you may resume normal activities.  If your test is positive please continue to quarantine for at least 5 days from your symptom onset or until you are without a fever for at least 24 hours after the medications.    You can take Tylenol and/or Ibuprofen as needed for fever reduction and pain relief.   For cough: honey 1/2 to 1 teaspoon (you can dilute the honey in water or another fluid).  You can also use guaifenesin and dextromethorphan for cough. You can use a humidifier for chest congestion and cough.  If you don't have a humidifier, you can sit in the bathroom with the hot shower running.      For sore throat: try warm salt water gargles, cepacol lozenges, throat spray, warm tea or water with lemon/honey, popsicles or ice, or OTC cold relief medicine for throat discomfort.   For congestion: take a daily anti-histamine like Zyrtec, Claritin, and a oral decongestant, such as pseudoephedrine.  You can also use Flonase 1-2 sprays in each nostril daily.   It is important to stay hydrated: drink plenty of fluids (water, gatorade/powerade/pedialyte, juices, or teas) to keep your throat moisturized and help further relieve irritation/discomfort.   

## 2022-01-03 ENCOUNTER — Emergency Department (HOSPITAL_COMMUNITY): Payer: Self-pay

## 2022-01-03 ENCOUNTER — Encounter (HOSPITAL_COMMUNITY): Payer: Self-pay

## 2022-01-03 ENCOUNTER — Emergency Department (HOSPITAL_COMMUNITY)
Admission: EM | Admit: 2022-01-03 | Discharge: 2022-01-03 | Disposition: A | Discharge status: Home / Self Care | Payer: Self-pay | Attending: Emergency Medicine | Admitting: Emergency Medicine

## 2022-01-03 ENCOUNTER — Other Ambulatory Visit: Payer: Self-pay

## 2022-01-03 DIAGNOSIS — R0602 Shortness of breath: Secondary | ICD-10-CM | POA: Insufficient documentation

## 2022-01-03 DIAGNOSIS — R079 Chest pain, unspecified: Secondary | ICD-10-CM

## 2022-01-03 DIAGNOSIS — R0789 Other chest pain: Secondary | ICD-10-CM | POA: Insufficient documentation

## 2022-01-03 DIAGNOSIS — M25531 Pain in right wrist: Secondary | ICD-10-CM | POA: Insufficient documentation

## 2022-01-03 LAB — CBC
HCT: 45.3 % (ref 39.0–52.0)
Hemoglobin: 16.2 g/dL (ref 13.0–17.0)
MCH: 33.2 pg (ref 26.0–34.0)
MCHC: 35.8 g/dL (ref 30.0–36.0)
MCV: 92.8 fL (ref 80.0–100.0)
Platelets: 166 10*3/uL (ref 150–400)
RBC: 4.88 MIL/uL (ref 4.22–5.81)
RDW: 13.3 % (ref 11.5–15.5)
WBC: 5.3 10*3/uL (ref 4.0–10.5)
nRBC: 0 % (ref 0.0–0.2)

## 2022-01-03 LAB — D-DIMER, QUANTITATIVE: D-Dimer, Quant: 0.43 ug/mL-FEU (ref 0.00–0.50)

## 2022-01-03 LAB — BASIC METABOLIC PANEL
Anion gap: 9 (ref 5–15)
BUN: 11 mg/dL (ref 6–20)
CO2: 26 mmol/L (ref 22–32)
Calcium: 8.9 mg/dL (ref 8.9–10.3)
Chloride: 102 mmol/L (ref 98–111)
Creatinine, Ser: 0.95 mg/dL (ref 0.61–1.24)
GFR, Estimated: >60 mL/min (ref >60)
Glucose, Bld: 105 mg/dL — ABNORMAL HIGH (ref 70–99)
Potassium: 3.7 mmol/L (ref 3.5–5.1)
Sodium: 137 mmol/L (ref 135–145)

## 2022-01-03 LAB — TROPONIN I (HIGH SENSITIVITY)
Troponin I (High Sensitivity): 4 ng/L (ref <18)
Troponin I (High Sensitivity): 4 ng/L (ref <18)

## 2022-01-03 MED ORDER — METHOCARBAMOL 500 MG PO TABS: 500.0000 mg | ORAL_TABLET | Freq: Three times a day (TID) | ORAL | 0 refills | Status: DC | PRN

## 2022-01-03 NOTE — ED Provider Notes (Signed)
?[facility] DEPT ?Provider Note ? ? ?CSN: [id number] ?Arrival date & time: 01/03/22  1532 ? ?  ? ?History ? ?Chief Complaint  ?Patient presents with  ? Chest Pain  ? Wrist Pain  ? ? ?Benjamin Vargas is a 36 y.o. male. ? ? ?Chest Pain ?Associated symptoms: shortness of breath   ?Associated symptoms: no abdominal pain, no back pain, no cough and no weakness   ?Wrist Pain ?Associated symptoms include chest pain and shortness of breath. Pertinent negatives include no abdominal pain.  ?Patient presents with chest pain.  Began yesterday.  Right lower chest.  Short of breath.  Worse with certain movements.  No known injury.  No cough.  Patient is a smoker.  No known cardiac history. ?Also some pain on right wrist.  Is right-handed.  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  Slight pain with movement of the wrist.  No swelling of his legs.  States he previously had some pain on the chest that was on the left side but similar. ?  ? ?Home Medications ?Prior to Admission medications   ?Medication Sig Start Date End Date Taking? Authorizing Provider  ?methocarbamol (ROBAXIN) 500 MG tablet Take 1 tablet (500 mg total) by mouth every 8 (eight) hours as needed for muscle spasms. 01/03/22  Yes Davonna Belling, MD  ?naproxen (NAPROSYN) 500 MG tablet Take 1 tablet (500 mg total) by mouth 2 (two) times daily. ?Patient not taking: Reported on 01/03/2022 05/25/21   Melynda Ripple, MD  ?predniSONE (DELTASONE) 50 MG tablet Take 1 tablet (50 mg total) by mouth daily with breakfast. ?Patient not taking: Reported on 01/03/2022 05/25/21   Melynda Ripple, MD  ?   ? ?Allergies    ?Patient has no known allergies.   ? ?Review of Systems   ?Review of Systems  ?Constitutional:  Negative for appetite change.  ?Respiratory:  Positive for shortness of breath. Negative for cough.   ?Cardiovascular:  Positive for chest pain.  ?Gastrointestinal:  Negative for abdominal pain.  ?Musculoskeletal:  Negative for back pain.   ?Neurological:  Negative for weakness.  ? ?Physical Exam ?Updated Vital Signs ?BP (!) 145/91   Pulse 80   Temp 98.9 ?F (37.2 ?C) (Oral)   Resp (!) 23   Ht 6\' 1"  (1.854 m)   Wt 95 kg   SpO2 95%   BMI 27.64 kg/m?  ?Physical Exam ?Vitals and nursing note reviewed.  ?Cardiovascular:  ?   Rate and Rhythm: Normal rate and regular rhythm.  ?Pulmonary:  ?   Breath sounds: No wheezing.  ?Chest:  ?   Comments: Pain with movement of right lower anterior chest wall.  No crash.  No specific point of tenderness. ?Abdominal:  ?   Tenderness: There is no abdominal tenderness.  ?Musculoskeletal:  ?   Right lower leg: No edema.  ?   Left lower leg: No edema.  ?   Comments: Some tenderness with slight swelling over dorsum of right wrist.  ?Skin: ?   Capillary Refill: Capillary refill takes less than 2 seconds.  ?Neurological:  ?   Mental Status: He is alert.  ? ? ?ED Results / Procedures / Treatments   ?Labs ?(all labs ordered are listed, but only abnormal results are displayed) ?Labs Reviewed  ?BASIC METABOLIC PANEL - Abnormal; Notable for the following components:  ?    Result Value  ? Glucose, Bld 105 (*)   ? All other components within normal limits  ?CBC  ?D-DIMER,  QUANTITATIVE  ?TROPONIN I (HIGH SENSITIVITY)  ?TROPONIN I (HIGH SENSITIVITY)  ? ? ?EKG ?EKG Interpretation ? ?Date/Time:  Monday January 03 2022 16:52:32 EDT ?Ventricular Rate:  80 ?PR Interval:  166 ?QRS Duration: 92 ?QT Interval:  370 ?QTC Calculation: 427 ?R Axis:   259 ?Text Interpretation: Sinus rhythm Consider right atrial enlargement Left anterior fascicular block Right ventricular hypertrophy ST elev, probable normal early repol pattern No significant change since last tracing Confirmed by Davonna Belling [id number]) on 01/03/2022 4:57:53 PM ? ?Radiology ?DG Chest Port 1 View ? ?Result Date: 01/03/2022 ?CLINICAL DATA:  Mid to low chest pain since yesterday. EXAM: PORTABLE CHEST 1 VIEW COMPARISON:  Radiographs 05/25/2021 and 06/10/2010. FINDINGS: 1622 hours.  The heart size and mediastinal contours are normal. The lungs are clear. There is no pleural effusion or pneumothorax. No acute osseous findings are identified. Telemetry leads overlie the chest. IMPRESSION: Stable chest.  No evidence of active cardiopulmonary process. Electronically Signed   By: Richardean Sale M.D.   On: 01/03/2022 16:27   ? ?Procedures ?Procedures  ? ? ?Medications Ordered in ED ?Medications - No data to display ? ?ED Course/ Medical Decision Making/ A&P ?  ?                        ?Medical Decision Making ?Amount and/or Complexity of Data Reviewed ?Labs: ordered. ? ?Risk ?Prescription drug management. ? ? ?Patient denies the chest pain.  Right chest.  Worse with movements.  Not hypoxic but does feel short of breath with it.  Does smoke.  With higher risk D-dimer done and negative.  Troponin also negative.  Chest x-ray independently interpreted and reassuring.  I think likely musculoskeletal pain.  No rash.  Shingles felt less likely.  No abdominal tenderness.  Discharge home with outpatient follow-up.  Symptomatic treatment of muscle relaxers.  Also right wrist pain.  Slight swelling on dorsum of wrist.  Potentially could be a ganglion cyst.  Splint for comfort and follow-up with hand surgery as needed.  Discharge home. ? ? ? ? ? ? ? ?Final Clinical Impression(s) / ED Diagnoses ?Final diagnoses:  ?Right wrist pain  ?Nonspecific chest pain  ? ? ?Rx / DC Orders ?ED Discharge Orders   ? ?      Ordered  ?  methocarbamol (ROBAXIN) 500 MG tablet  Every 8 hours PRN       ? 01/03/22 1951  ? ?  ?  ? ?  ? ? ?  ?Davonna Belling, MD ?01/03/22 1956 ? ?

## 2022-01-03 NOTE — ED Triage Notes (Signed)
Patient reports that he began having mid lower chest pain that radiates into his back since yesterday. Patient also c/o SOB and states he can not take a full breath due to the pain. Patient also states, "If I move a certain way I have increased pain." ? ?Patient has a raised area and pain to the right wrist. 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 ?

## 2022-01-03 NOTE — Progress Notes (Signed)
Orthopedic Tech Progress Note ?Patient Details:  ?Benjamin Vargas ?[birth year] ?[id number] ? ?Ortho Devices ?Type of Ortho Device: Velcro wrist splint ?Ortho Device/Splint Location: right ?Ortho Device/Splint Interventions: Application ?  ?Post Interventions ?Patient Tolerated: Well ?Instructions Provided: Care of device ? ?Benjamin Vargas ?01/03/2022, 8:20 PM ? ?

## 2022-03-14 ENCOUNTER — Ambulatory Visit
Admission: EM | Admit: 2022-03-14 | Discharge: 2022-03-14 | Disposition: A | Discharge status: Home / Self Care | Payer: BC Managed Care – PPO | Attending: Family Medicine | Admitting: Family Medicine

## 2022-03-14 DIAGNOSIS — K047 Periapical abscess without sinus: Secondary | ICD-10-CM

## 2022-03-14 MED ORDER — CHLORHEXIDINE GLUCONATE 0.12 % MT SOLN: 15.0000 mL | Freq: Two times a day (BID) | OROMUCOSAL | 0 refills | Status: DC

## 2022-03-14 MED ORDER — LIDOCAINE VISCOUS HCL 2 % MT SOLN: 10.0000 mL | OROMUCOSAL | 0 refills | Status: DC | PRN

## 2022-03-14 MED ORDER — AMOXICILLIN-POT CLAVULANATE 875-125 MG PO TABS: 1.0000 | ORAL_TABLET | Freq: Two times a day (BID) | ORAL | 0 refills | Status: DC

## 2022-03-14 NOTE — ED Triage Notes (Signed)
Patient presents to Urgent Care with complaints of L side dental pain since yesterday. Patient reports pain 10/10 starting yesterday and swelling today. Pt reports tooth issue last year when in prison and he only allowed them to pull one of the hurt tooth. No current dentist. Pt reports no otc medications

## 2022-03-14 NOTE — ED Provider Notes (Signed)
[facility]    CSN: [id number] Arrival date & time: 03/14/22  1114      History   Chief Complaint Chief Complaint  Patient presents with   Dental Pain   HPI Benjamin Vargas is a 36 y.o. male.   Presenting today with 1 day history of left upper dental pain, facial swelling.  States that he had a tooth pulled about a year ago in this area and was told that he might have issues with this current tooth causing pain but declined getting it pulled back in that time.  No new injury that he is aware of.  Has not been trying anything over-the-counter for symptoms thus far.  States his pain is a 10 out of 10.   No past medical history on file.  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 Authorizing Provider  amoxicillin-clavulanate (AUGMENTIN) 875-125 MG tablet Take 1 tablet by mouth every 12 (twelve) hours. 03/14/22  Yes Particia Nearing, PA-C  chlorhexidine (PERIDEX) 0.12 % solution Use as directed 15 mLs in the mouth or throat 2 (two) times daily. 03/14/22  Yes Particia Nearing, PA-C  lidocaine (XYLOCAINE) 2 % solution Use as directed 10 mLs in the mouth or throat every 3 (three) hours as needed for mouth pain. 03/14/22  Yes Particia Nearing, PA-C  methocarbamol (ROBAXIN) 500 MG tablet Take 1 tablet (500 mg total) by mouth every 8 (eight) hours as needed for muscle spasms. 01/03/22   Benjiman Core, MD  naproxen (NAPROSYN) 500 MG tablet Take 1 tablet (500 mg total) by mouth 2 (two) times daily. Patient not taking: Reported on 01/03/2022 05/25/21   Domenick Gong, MD  predniSONE (DELTASONE) 50 MG tablet Take 1 tablet (50 mg total) by mouth daily with  breakfast. Patient not taking: Reported on 01/03/2022 05/25/21   Domenick Gong, MD   Family History No family history on file.  Social History Social History   Tobacco Use   Smoking status: Every Day    Packs/day: 0.50    Types: Cigarettes   Smokeless tobacco: Never  Vaping Use   Vaping Use: Never used  Substance Use Topics   Alcohol use: Yes   Drug use: No   Allergies   Patient has no known allergies.  Review of Systems Review of Systems PER HPI  Physical Exam Triage Vital Signs ED Triage Vitals  Enc Vitals Group     BP 03/14/22 1203 (!) 149/94     Pulse Rate 03/14/22 1203 80     Resp 03/14/22 1203 17     Temp 03/14/22 1203 98.6 F (37 C)     Temp src --      SpO2 03/14/22 1203 95 %     Weight --      Height --      Head Circumference --      Peak Flow --      Pain Score 03/14/22 1202 10     Pain Loc --      Pain Edu? --      Excl. in GC? --    No data found.  Updated Vital Signs BP Marland Kitchen)  149/94   Pulse 80   Temp 98.6 F (37 C)   Resp 17   SpO2 95%   Visual Acuity Right Eye Distance:   Left Eye Distance:   Bilateral Distance:    Right Eye Near:   Left Eye Near:    Bilateral Near:     Physical Exam Vitals and nursing note reviewed.  Constitutional:      Appearance: Normal appearance.  HENT:     Head: Atraumatic.     Mouth/Throat:     Mouth: Mucous membranes are moist.     Comments: Gingival erythema and edema, broken molar upper left Eyes:     Extraocular Movements: Extraocular movements intact.     Conjunctiva/sclera: Conjunctivae normal.  Cardiovascular:     Rate and Rhythm: Normal rate and regular rhythm.  Pulmonary:     Effort: Pulmonary effort is normal.     Breath sounds: Normal breath sounds.  Musculoskeletal:        General: Normal range of motion.     Cervical back: Normal range of motion and neck supple.  Lymphadenopathy:     Cervical: No cervical adenopathy.  Skin:    General: Skin is warm and dry.  Neurological:      General: No focal deficit present.     Mental Status: He is oriented to person, place, and time.  Psychiatric:        Mood and Affect: Mood normal.        Thought Content: Thought content normal.        Judgment: Judgment normal.   UC Treatments / Results  Labs (all labs ordered are listed, but only abnormal results are displayed) Labs Reviewed - No data to display  EKG   Radiology No results found.  Procedures Procedures (including critical care time)  Medications Ordered in UC Medications - No data to display  Initial Impression / Assessment and Plan / UC Course  I have reviewed the triage vital signs and the nursing notes.  Pertinent labs & imaging results that were available during my care of the patient were reviewed by me and considered in my medical decision making (see chart for details).     Treat with Augmentin, Peridex, viscous lidocaine, over-the-counter pain relievers.  Close follow-up with dentist recommended.  Work note given.  Final Clinical Impressions(s) / UC Diagnoses   Final diagnoses:  Dental infection   Discharge Instructions   None    ED Prescriptions     Medication Sig Dispense Auth. Provider   amoxicillin-clavulanate (AUGMENTIN) 875-125 MG tablet Take 1 tablet by mouth every 12 (twelve) hours. 14 tablet Particia Nearing, [place]   chlorhexidine (PERIDEX) 0.12 % solution Use as directed 15 mLs in the mouth or throat 2 (two) times daily. 120 mL Particia Nearing, PA-C   lidocaine (XYLOCAINE) 2 % solution Use as directed 10 mLs in the mouth or throat every 3 (three) hours as needed for mouth pain. 100 mL Particia Nearing, [place]      PDMP not reviewed this encounter.   Particia Nearing, [place] 03/14/22 1248

## 2022-05-23 ENCOUNTER — Ambulatory Visit (INDEPENDENT_AMBULATORY_CARE_PROVIDER_SITE_OTHER): Payer: BC Managed Care – PPO

## 2022-05-23 ENCOUNTER — Ambulatory Visit
Admission: EM | Admit: 2022-05-23 | Discharge: 2022-05-23 | Disposition: A | Discharge status: Home / Self Care | Payer: BC Managed Care – PPO | Attending: Physician Assistant | Admitting: Physician Assistant

## 2022-05-23 DIAGNOSIS — M79671 Pain in right foot: Secondary | ICD-10-CM

## 2022-05-23 DIAGNOSIS — M79672 Pain in left foot: Secondary | ICD-10-CM

## 2022-05-23 LAB — POCT FASTING CBG KUC MANUAL ENTRY: POCT Glucose (KUC): 121 mg/dL — AB (ref 70–99)

## 2022-05-23 MED ORDER — PREDNISONE 20 MG PO TABS: 40.0000 mg | ORAL_TABLET | Freq: Every day | ORAL | 0 refills | Status: AC

## 2022-05-23 NOTE — ED Provider Notes (Signed)
[facility]    CSN: [id number] Arrival date & time: 05/23/22  1531      History   Chief Complaint Chief Complaint  Patient presents with   Foot Pain    HPI Benjamin Vargas is a 36 y.o. male.   Patient here today for evaluation of bilateral foot pain that is been ongoing for the last few months.  He reports that standing which he has to do at work seems to worsen pain.  He reports that he walks on concrete floors.  He reports some numbness and tingling.  He has not had fever.  He does not currently have primary care provider.  The history is provided by the patient.  Foot Pain    History reviewed. No pertinent past medical history.  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 Authorizing Provider  predniSONE (DELTASONE) 20 MG tablet Take 2 tablets (40 mg total) by mouth daily with breakfast for 5 days. 05/23/22 05/28/22 Yes Tomi Bamberger, PA-C  amoxicillin-clavulanate (AUGMENTIN) 875-125 MG tablet Take 1 tablet by mouth every 12 (twelve) hours. 03/14/22   Particia Nearing, PA-C  chlorhexidine (PERIDEX) 0.12 % solution Use as directed 15 mLs in the mouth or throat 2 (two) times daily. 03/14/22   Particia Nearing, PA-C  lidocaine (XYLOCAINE) 2 % solution Use as directed 10 mLs in the mouth or throat every 3 (three) hours as needed for mouth pain. 03/14/22   Particia Nearing, PA-C  methocarbamol (ROBAXIN) 500 MG tablet Take 1 tablet (500 mg total) by mouth every 8 (eight) hours as needed for muscle spasms. 01/03/22   Benjiman Core, MD  naproxen (NAPROSYN) 500 MG tablet Take 1 tablet (500 mg total) by mouth 2 (two) times daily. Patient not  taking: Reported on 01/03/2022 05/25/21   Domenick Gong, MD    Family History Family History  Family history unknown: Yes    Social History Social History   Tobacco Use   Smoking status: Every Day    Packs/day: 0.50    Types: Cigarettes   Smokeless tobacco: Never  Vaping Use   Vaping Use: Never used  Substance Use Topics   Alcohol use: Yes   Drug use: No     Allergies   Patient has no known allergies.   Review of Systems Review of Systems  Constitutional:  Negative for chills and fever.  Eyes:  Negative for discharge and redness.  Musculoskeletal:  Positive for arthralgias.  Skin:  Negative for wound.  Neurological:  Negative for numbness.     Physical Exam Triage Vital Signs ED Triage Vitals  Enc Vitals Group     BP 05/23/22 1544 112/74     Pulse Rate 05/23/22 1544 78     Resp 05/23/22 1544 20     Temp 05/23/22 1544 98.4 F (36.9 C)     Temp Source 05/23/22 1544 Oral     SpO2 05/23/22 1544 98 %     Weight --      Height --      Head Circumference --      Peak Flow --  Pain Score 05/23/22 1545 7     Pain Loc --      Pain Edu? --      Excl. in GC? --    No data found.  Updated Vital Signs BP 112/74 (BP Location: Left Arm)   Pulse 78   Temp 98.4 F (36.9 C) (Oral)   Resp 20   SpO2 98%      Physical Exam Vitals and nursing note reviewed.  Constitutional:      General: He is not in acute distress.    Appearance: Normal appearance. He is not ill-appearing.  HENT:     Head: Normocephalic and atraumatic.  Eyes:     Conjunctiva/sclera: Conjunctivae normal.  Cardiovascular:     Rate and Rhythm: Normal rate.  Pulmonary:     Effort: Pulmonary effort is normal.  Musculoskeletal:     Comments: Multiple calluses noted to bilateral feet where patient reports pain, no wounds, erythema.  Neurological:     Mental Status: He is alert.  Psychiatric:        Mood and Affect: Mood normal.        Behavior: Behavior normal.        Thought Content:  Thought content normal.      UC Treatments / Results  Labs (all labs ordered are listed, but only abnormal results are displayed) Labs Reviewed  POCT FASTING CBG [facility] MANUAL ENTRY - Abnormal; Notable for the following components:      Result Value   POCT Glucose ([facility]) 121 (*)    All other components within normal limits    EKG   Radiology DG Foot Complete Right  Result Date: 05/23/2022 CLINICAL DATA:  Right foot pain for several months without known injury. EXAM: RIGHT FOOT COMPLETE - 3+ VIEW COMPARISON:  None Available. FINDINGS: There is no evidence of fracture or dislocation. There is no evidence of arthropathy or other focal bone abnormality. Soft tissues are unremarkable. IMPRESSION: Negative. Electronically Signed   By: Lupita Raider M.D.   On: 05/23/2022 16:19   DG Foot Complete Left  Result Date: 05/23/2022 CLINICAL DATA:  Left foot pain for several months without known injury. EXAM: LEFT FOOT - COMPLETE 3+ VIEW COMPARISON:  None Available. FINDINGS: There is no evidence of fracture or dislocation. There is no evidence of arthropathy or other focal bone abnormality. Soft tissues are unremarkable. IMPRESSION: Negative. Electronically Signed   By: Lupita Raider M.D.   On: 05/23/2022 16:17    Procedures Procedures (including critical care time)  Medications Ordered in UC Medications - No data to display  Initial Impression / Assessment and Plan / UC Course  I have reviewed the triage vital signs and the nursing notes.  Pertinent labs & imaging results that were available during my care of the patient were reviewed by me and considered in my medical decision making (see chart for details).    Will trial steroids for pain and recommended further evaluation by PCP- referral to podiatry as calluses may need to be removed. Encouraged follow up with any further concerns.   Final Clinical Impressions(s) / UC Diagnoses   Final diagnoses:  Bilateral foot pain      Discharge Instructions       Please make appointment with PCP.     ED Prescriptions     Medication Sig Dispense Auth. Provider   predniSONE (DELTASONE) 20 MG tablet Take 2 tablets (40 mg total) by mouth daily with breakfast for 5 days. 10 tablet Izola Price,  Armandina Stammer, PA-C      PDMP not reviewed this encounter.   Tomi Bamberger, PA-C 05/23/22 1725

## 2022-05-23 NOTE — Discharge Instructions (Addendum)
Please make appointment with PCP

## 2022-05-23 NOTE — ED Triage Notes (Signed)
Pt presents with chronic bilateral foot pain for past few months.

## 2022-06-24 ENCOUNTER — Ambulatory Visit (INDEPENDENT_AMBULATORY_CARE_PROVIDER_SITE_OTHER): Payer: BC Managed Care – PPO | Admitting: Podiatry

## 2022-06-24 DIAGNOSIS — Z91199 Patient's noncompliance with other medical treatment and regimen due to unspecified reason: Secondary | ICD-10-CM

## 2022-06-24 NOTE — Progress Notes (Signed)
No show

## 2023-04-08 IMAGING — DX DG CHEST 1V PORT
1 series · 1 of 1 positions shown · non-contrast
Comparison: Radiographs 05/25/2021 and 06/10/2010.

CLINICAL DATA: Mid to low chest pain since yesterday.

EXAM:
PORTABLE CHEST 1 VIEW

[chest ap]
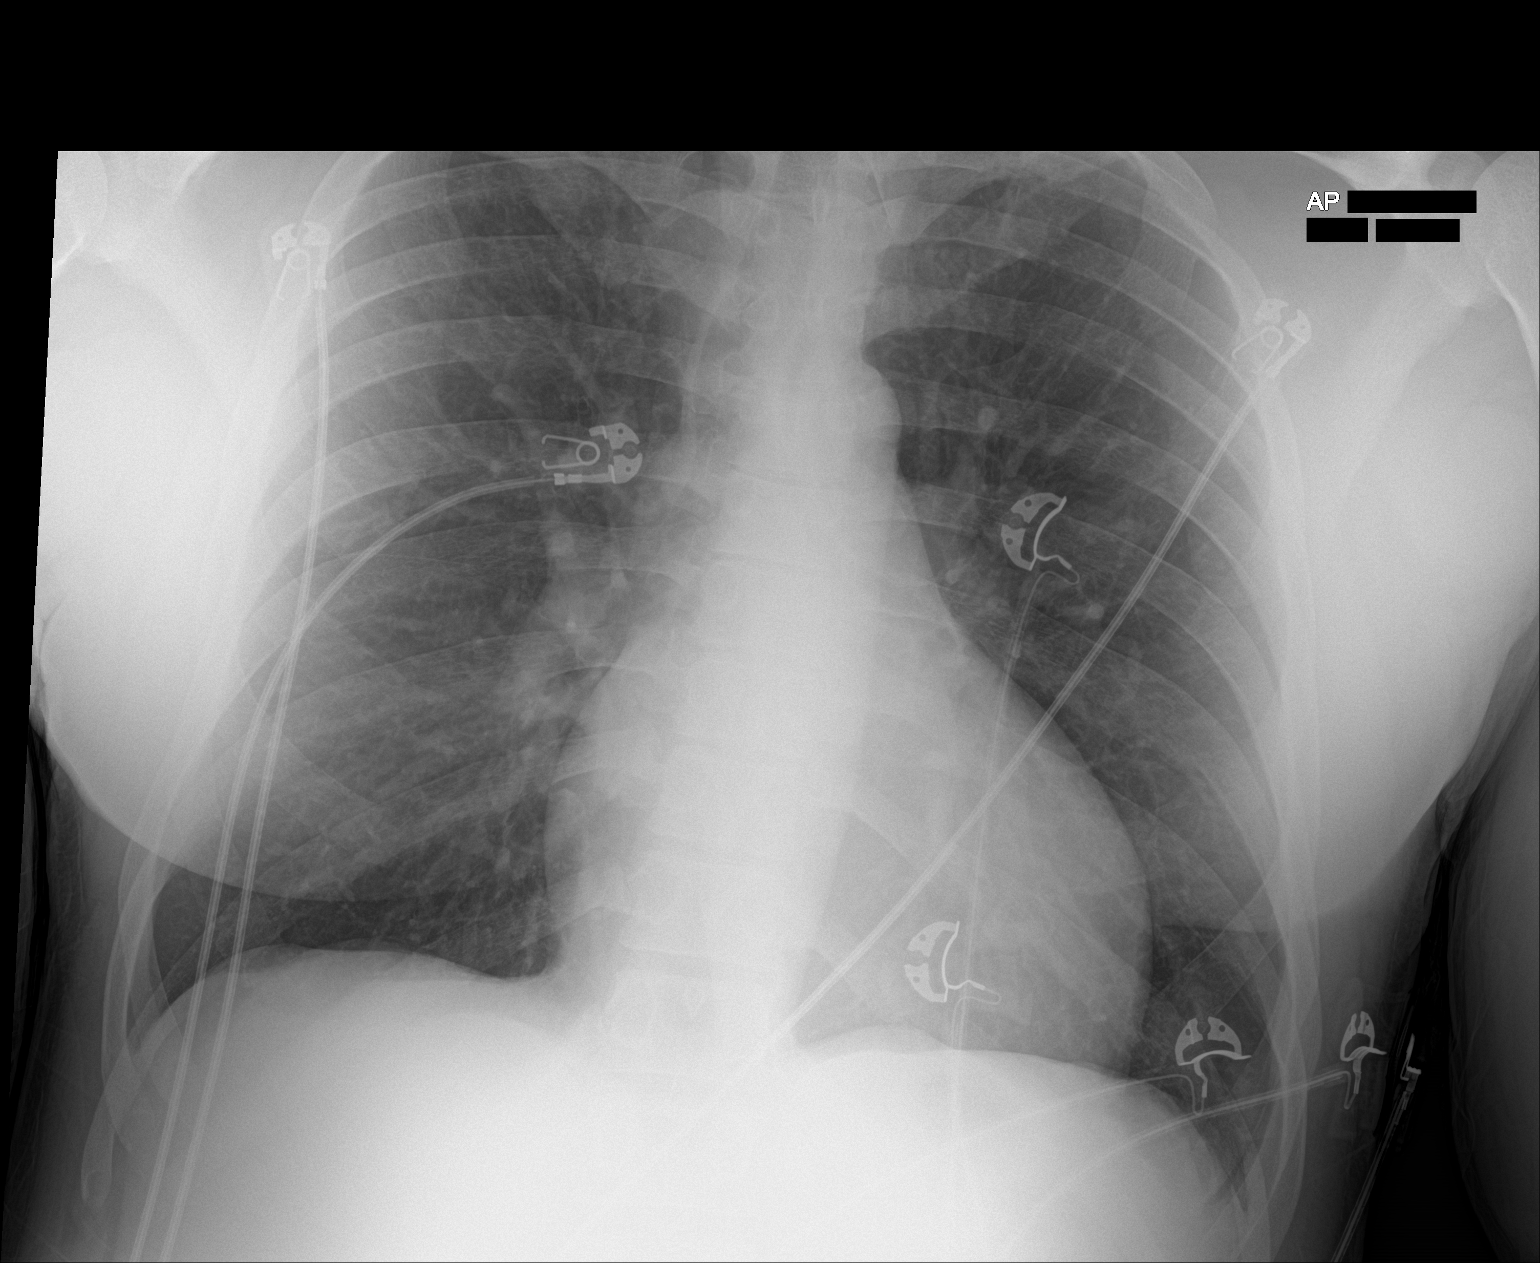

[1 of 1 positions shown; findings below may reference images not displayed]

FINDINGS: 8544 hours. The heart size and mediastinal contours are normal. The
lungs are clear. There is no pleural effusion or pneumothorax. No
acute osseous findings are identified. Telemetry leads overlie the
chest.
IMPRESSION: Stable chest.  No evidence of active cardiopulmonary process.

## 2023-05-25 DIAGNOSIS — Z419 Encounter for procedure for purposes other than remedying health state, unspecified: Secondary | ICD-10-CM | POA: Diagnosis not present

## 2023-06-25 DIAGNOSIS — Z419 Encounter for procedure for purposes other than remedying health state, unspecified: Secondary | ICD-10-CM | POA: Diagnosis not present

## 2023-07-05 DIAGNOSIS — H5213 Myopia, bilateral: Secondary | ICD-10-CM | POA: Diagnosis not present

## 2023-07-25 DIAGNOSIS — Z419 Encounter for procedure for purposes other than remedying health state, unspecified: Secondary | ICD-10-CM | POA: Diagnosis not present

## 2023-08-02 ENCOUNTER — Encounter: Payer: Self-pay | Admitting: General Practice

## 2023-08-25 DIAGNOSIS — Z419 Encounter for procedure for purposes other than remedying health state, unspecified: Secondary | ICD-10-CM | POA: Diagnosis not present

## 2023-09-07 ENCOUNTER — Ambulatory Visit: Payer: Medicaid Other | Admitting: Family Medicine

## 2023-09-24 DIAGNOSIS — Z419 Encounter for procedure for purposes other than remedying health state, unspecified: Secondary | ICD-10-CM | POA: Diagnosis not present

## 2023-10-10 ENCOUNTER — Telehealth: Payer: Self-pay | Admitting: General Practice

## 2023-10-10 ENCOUNTER — Ambulatory Visit: Payer: 59 | Admitting: Family Medicine

## 2023-10-10 NOTE — Telephone Encounter (Signed)
Called patient to reschedule missed appointment no answer left voicemail

## 2023-10-25 DIAGNOSIS — Z419 Encounter for procedure for purposes other than remedying health state, unspecified: Secondary | ICD-10-CM | POA: Diagnosis not present

## 2023-11-21 ENCOUNTER — Emergency Department (HOSPITAL_COMMUNITY): Payer: 59

## 2023-11-21 ENCOUNTER — Encounter (HOSPITAL_COMMUNITY): Payer: Self-pay

## 2023-11-21 ENCOUNTER — Other Ambulatory Visit: Payer: Self-pay

## 2023-11-21 ENCOUNTER — Emergency Department (HOSPITAL_COMMUNITY)
Admission: EM | Admit: 2023-11-21 | Discharge: 2023-11-21 | Disposition: A | Discharge status: Home / Self Care | Payer: 59 | Attending: Emergency Medicine | Admitting: Emergency Medicine

## 2023-11-21 DIAGNOSIS — S025XXA Fracture of tooth (traumatic), initial encounter for closed fracture: Secondary | ICD-10-CM | POA: Diagnosis not present

## 2023-11-21 DIAGNOSIS — Z23 Encounter for immunization: Secondary | ICD-10-CM | POA: Insufficient documentation

## 2023-11-21 DIAGNOSIS — Y9301 Activity, walking, marching and hiking: Secondary | ICD-10-CM | POA: Diagnosis not present

## 2023-11-21 DIAGNOSIS — R55 Syncope and collapse: Secondary | ICD-10-CM | POA: Insufficient documentation

## 2023-11-21 DIAGNOSIS — S01511A Laceration without foreign body of lip, initial encounter: Secondary | ICD-10-CM | POA: Diagnosis not present

## 2023-11-21 DIAGNOSIS — R251 Tremor, unspecified: Secondary | ICD-10-CM | POA: Insufficient documentation

## 2023-11-21 DIAGNOSIS — W01198A Fall on same level from slipping, tripping and stumbling with subsequent striking against other object, initial encounter: Secondary | ICD-10-CM | POA: Insufficient documentation

## 2023-11-21 DIAGNOSIS — Z20822 Contact with and (suspected) exposure to covid-19: Secondary | ICD-10-CM | POA: Insufficient documentation

## 2023-11-21 DIAGNOSIS — S0993XA Unspecified injury of face, initial encounter: Secondary | ICD-10-CM | POA: Diagnosis present

## 2023-11-21 LAB — URINALYSIS, ROUTINE W REFLEX MICROSCOPIC
Bacteria, UA: NONE SEEN
Bilirubin Urine: NEGATIVE
Glucose, UA: NEGATIVE mg/dL
Hgb urine dipstick: NEGATIVE
Ketones, ur: NEGATIVE mg/dL
Leukocytes,Ua: NEGATIVE
Nitrite: NEGATIVE
Protein, ur: 30 mg/dL — AB
Specific Gravity, Urine: 1.013 (ref 1.005–1.030)
pH: 6 (ref 5.0–8.0)

## 2023-11-21 LAB — BASIC METABOLIC PANEL
Anion gap: 10 (ref 5–15)
BUN: 10 mg/dL (ref 6–20)
CO2: 23 mmol/L (ref 22–32)
Calcium: 8.9 mg/dL (ref 8.9–10.3)
Chloride: 100 mmol/L (ref 98–111)
Creatinine, Ser: 1.11 mg/dL (ref 0.61–1.24)
GFR, Estimated: >60 mL/min (ref >60)
Glucose, Bld: 118 mg/dL — ABNORMAL HIGH (ref 70–99)
Potassium: 3.9 mmol/L (ref 3.5–5.1)
Sodium: 133 mmol/L — ABNORMAL LOW (ref 135–145)

## 2023-11-21 LAB — CBC
HCT: 54.7 % — ABNORMAL HIGH (ref 39.0–52.0)
Hemoglobin: 19 g/dL — ABNORMAL HIGH (ref 13.0–17.0)
MCH: 33.6 pg (ref 26.0–34.0)
MCHC: 34.7 g/dL (ref 30.0–36.0)
MCV: 96.8 fL (ref 80.0–100.0)
Platelets: 190 10*3/uL (ref 150–400)
RBC: 5.65 MIL/uL (ref 4.22–5.81)
RDW: 12.3 % (ref 11.5–15.5)
WBC: 7.7 10*3/uL (ref 4.0–10.5)
nRBC: 0 % (ref 0.0–0.2)

## 2023-11-21 LAB — RESP PANEL BY RT-PCR (RSV, FLU A&B, COVID)  RVPGX2
Influenza A by PCR: NEGATIVE
Influenza B by PCR: NEGATIVE
Resp Syncytial Virus by PCR: NEGATIVE
SARS Coronavirus 2 by RT PCR: NEGATIVE

## 2023-11-21 LAB — CBG MONITORING, ED: Glucose-Capillary: 130 mg/dL — ABNORMAL HIGH (ref 70–99)

## 2023-11-21 LAB — TROPONIN I (HIGH SENSITIVITY): Troponin I (High Sensitivity): 6 ng/L (ref <18)

## 2023-11-21 MED ORDER — LIDOCAINE-EPINEPHRINE (PF) 2 %-1:200000 IJ SOLN
10.0000 mL | Freq: Once | INTRAMUSCULAR | Status: AC
  Administered 2023-11-21: 10 mL via INTRADERMAL
  Filled 2023-11-21: qty 20

## 2023-11-21 MED ORDER — OXYCODONE-ACETAMINOPHEN 5-325 MG PO TABS
1.0000 | ORAL_TABLET | Freq: Once | ORAL | Status: AC
  Administered 2023-11-21: 1 via ORAL
  Filled 2023-11-21: qty 1

## 2023-11-21 MED ORDER — IBUPROFEN 600 MG PO TABS: 600.0000 mg | ORAL_TABLET | Freq: Four times a day (QID) | ORAL | 0 refills | Status: DC | PRN

## 2023-11-21 MED ORDER — TETANUS-DIPHTH-ACELL PERTUSSIS 5-2.5-18.5 LF-MCG/0.5 IM SUSY
0.5000 mL | PREFILLED_SYRINGE | Freq: Once | INTRAMUSCULAR | Status: AC
  Administered 2023-11-21: 0.5 mL via INTRAMUSCULAR
  Filled 2023-11-21: qty 0.5

## 2023-11-21 NOTE — ED Triage Notes (Signed)
C/o having headache, blurry vision, hot flashes that lead to syncopal episode 2 hr PTA.  Pt reports busted lip  Denies blood thinner usage.

## 2023-11-21 NOTE — ED Provider Triage Note (Signed)
Emergency Medicine Provider Triage Evaluation Note  MOZES SAGAR , a 38 y.o. male  was evaluated in triage.  Pt complains of headache, blurry vision, syncope.  Reports history of skull fracture repair in the past.  Reports that he has had syncope episodes in the past.  He reports tobacco use but denies any other drug, alcohol use.  He denies any chest pain, but does endorse shortness of breath.  He broke his right front tooth and has a laceration to his lower lip.  He is not sure when his last tetanus shot was..  Review of Systems  Positive: Syncope, collapse, laceration, shob Negative: Chest pain  Physical Exam  BP (!) 144/96   Pulse 85   Temp 98.7 F (37.1 C)   Resp 18   Ht 6\' 1"  (1.854 m)   Wt 95 kg   SpO2 100%   BMI 27.63 kg/m  Gen:   Awake, no distress   Resp:  Normal effort  MSK:   Moves extremities without difficulty  Other:  Laceration of midline lower lip with tooth fragments noted, was able to remove the foreign body without difficulty, no other foreign bodies noted on full wound exploration.  Medical Decision Making  Medically screening exam initiated at 4:52 PM.  Appropriate orders placed.  BURRIS MATHERNE was informed that the remainder of the evaluation will be completed by another provider, this initial triage assessment does not replace that evaluation, and the importance of remaining in the ED until their evaluation is complete.  Workup initiated in triage    Olene Floss, [place] 11/21/23 1659

## 2023-11-21 NOTE — Discharge Instructions (Signed)
You been evaluated for your passing out episode.  You have been diagnosed with a broken tooth as well as lip laceration.  Absorbable sutures was used to repair your lip.  Please eat soft food, rinse mouth regularly with mouthwash to decrease risk of infection, take ibuprofen as needed for pain.  You may follow-up with dentist for outpatient managements of your dental injury.  Return if you have any concern.

## 2023-11-21 NOTE — ED Provider Notes (Signed)
[place] EMERGENC[facility] DEPARTMENT AT [facility] Provider Note   CSN: [id number] Arrival date & time: 11/21/23  1550     History  Chief Complaint  Patient presents with   Loss of Consciousness    Benjamin Vargas is a 38 y.o. male.  The history is provided by the patient and medical records. No language interpreter was used.  Loss of Consciousness    38 year old male presenting for patient recent syncopal episode.  Patient report he was donating plasma earlier today and after he finished with the session, he went out and went home however he report afterward he felt hot flash, feeling very weak and shaky and as he was walking out he had a syncopal episode fell forward striking his face against the ground.  He is not complaining of pain to his mouth and states that he cut his lower lip in the process.  He denies significant neck pain denies any other injury.  He is unsure his last tetanus status.  he is requesting for pain medication.  Home Medications Prior to Admission medications   Medication Sig Start Date End Date Taking? Authorizing Provider  amoxicillin-clavulanate (AUGMENTIN) 875-125 MG tablet Take 1 tablet by mouth every 12 (twelve) hours. 03/14/22   Particia Nearing, PA-C  chlorhexidine (PERIDEX) 0.12 % solution Use as directed 15 mLs in the mouth or throat 2 (two) times daily. 03/14/22   Particia Nearing, PA-C  lidocaine (XYLOCAINE) 2 % solution Use as directed 10 mLs in the mouth or throat every 3 (three) hours as needed for mouth pain. 03/14/22   Particia Nearing, PA-C  methocarbamol (ROBAXIN) 500 MG tablet Take 1 tablet (500 mg total) by mouth every 8 (eight) hours as needed for muscle spasms. 01/03/22   Benjiman Core, MD  naproxen (NAPROSYN) 500 MG tablet Take 1 tablet (500 mg total) by mouth 2 (two) times daily. Patient not taking: Reported on 01/03/2022 05/25/21   Domenick Gong, MD      Allergies    Patient has no known allergies.    Review  of Systems   Review of Systems  Cardiovascular:  Positive for syncope.  All other systems reviewed and are negative.   Physical Exam Updated Vital Signs BP (!) 144/96   Pulse 85   Temp 98.7 F (37.1 C)   Resp 18   Ht 6\' 1"  (1.854 m)   Wt 95 kg   SpO2 100%   BMI 27.63 kg/m  Physical Exam Vitals and nursing note reviewed.  Constitutional:      General: He is not in acute distress.    Appearance: He is well-developed.  HENT:     Head: Normocephalic and atraumatic.     Mouth/Throat:     Comments: Mouth: There is an Rennis Harding type II dental fracture involving the right upper central incisor with tenderness to palpation.  No malocclusion noted.  Mid lower lip there is a dilated deep laceration involving the mucosal aspect of the lip.  It is not through and through and does not cross the vermilion border.  It is not actively bleeding.  A dental fragment were noted embedded inside the wound. Eyes:     Conjunctiva/sclera: Conjunctivae normal.  Cardiovascular:     Rate and Rhythm: Normal rate and regular rhythm.     Pulses: Normal pulses.     Heart sounds: Normal heart sounds.  Pulmonary:     Effort: Pulmonary effort is normal.     Breath sounds: Normal breath  sounds.  Abdominal:     Palpations: Abdomen is soft.  Musculoskeletal:     Cervical back: Normal range of motion and neck supple. No rigidity or tenderness.  Skin:    Findings: No rash.  Neurological:     Mental Status: He is alert.     GCS: GCS eye subscore is 4. GCS verbal subscore is 5. GCS motor subscore is 6.     Cranial Nerves: Cranial nerves 2-12 are intact.     Sensory: Sensation is intact.     Motor: Motor function is intact.     ED Results / Procedures / Treatments   Labs (all labs ordered are listed, but only abnormal results are displayed) Labs Reviewed  BASIC METABOLIC PANEL - Abnormal; Notable for the following components:      Result Value   Sodium 133 (*)    Glucose, Bld 118 (*)    All other  components within normal limits  CBC - Abnormal; Notable for the following components:   Hemoglobin 19.0 (*)    HCT 54.7 (*)    All other components within normal limits  URINALYSIS, ROUTINE W REFLEX MICROSCOPIC - Abnormal; Notable for the following components:   Protein, ur 30 (*)    All other components within normal limits  CBG MONITORING, ED - Abnormal; Notable for the following components:   Glucose-Capillary 130 (*)    All other components within normal limits  RESP PANEL BY RT-PCR (RSV, FLU A&B, COVID)  [id number]  TROPONIN I (HIGH SENSITIVITY)  TROPONIN I (HIGH SENSITIVITY)    EKG EKG Interpretation Date/Time:  Tuesday November 21 2023 16:20:37 EST Ventricular Rate:  84 PR Interval:  143 QRS Duration:  87 QT Interval:  359 QTC Calculation: 425 R Axis:   162  Text Interpretation: Sinus rhythm Probable right ventricular hypertrophy ST elevation suggests acute pericarditis Similar to 2023 tracings Confirmed by Alona Bene [phone]) on 11/21/2023 4:28:56 PM  Radiology CT Head Wo Contrast Result Date: 11/21/2023 CLINICAL DATA:  Head trauma, moderate-severe Headache. EXAM: CT HEAD WITHOUT CONTRAST TECHNIQUE: Contiguous axial images were obtained from the base of the skull through the vertex without intravenous contrast. RADIATION DOSE REDUCTION: This exam was performed according to the departmental dose-optimization program which includes automated exposure control, adjustment of the mA and/or kV according to patient size and/or use of iterative reconstruction technique. COMPARISON:  None Available. FINDINGS: Brain: No intracranial hemorrhage, mass effect, or midline shift. No hydrocephalus. The basilar cisterns are patent. No evidence of territorial infarct or acute ischemia. No extra-axial or intracranial fluid collection. Vascular: No hyperdense vessel or unexpected calcification. Skull: No fracture or focal lesion. Sinuses/Orbits: Assessed on concurrent face CT. Other: No confluent  scalp hematoma. IMPRESSION: No acute intracranial abnormality. No skull fracture. Electronically Signed   By: Narda Rutherford M.D.   On: 11/21/2023 18:39   CT Cervical Spine Wo Contrast Result Date: 11/21/2023 CLINICAL DATA:  Neck trauma, dangerous injury mechanism (Age 41-64y) EXAM: CT CERVICAL SPINE WITHOUT CONTRAST TECHNIQUE: Multidetector CT imaging of the cervical spine was performed without intravenous contrast. Multiplanar CT image reconstructions were also generated. RADIATION DOSE REDUCTION: This exam was performed according to the departmental dose-optimization program which includes automated exposure control, adjustment of the mA and/or kV according to patient size and/or use of iterative reconstruction technique. COMPARISON:  None Available. FINDINGS: Alignment: Straightening of normal lordosis. No traumatic subluxation. Skull base and vertebrae: No acute fracture. Vertebral body heights are maintained. The dens and skull base are intact. Soft tissues  and spinal canal: No prevertebral fluid or swelling. No visible canal hematoma. Disc levels: Disc space narrowing and spurring C4-C5, C5-C6, and C6-C7. Upper chest: No acute findings. Other: None. IMPRESSION: 1. No fracture or subluxation of the cervical spine. 2. Multilevel degenerative disc disease. Electronically Signed   By: Narda Rutherford M.D.   On: 11/21/2023 17:54   CT Maxillofacial Wo Contrast Result Date: 11/21/2023 CLINICAL DATA:  Facial trauma, blunt EXAM: CT MAXILLOFACIAL WITHOUT CONTRAST TECHNIQUE: Multidetector CT imaging of the maxillofacial structures was performed. Multiplanar CT image reconstructions were also generated. RADIATION DOSE REDUCTION: This exam was performed according to the departmental dose-optimization program which includes automated exposure control, adjustment of the mA and/or kV according to patient size and/or use of iterative reconstruction technique. COMPARISON:  None Available. FINDINGS: Osseous: No acute  fracture of the nasal bone, zygomatic arches or mandibles. The temporomandibular joints are congruent. There is leftward nasal septal deviation. Intact pterygoid plates. There is a periapical lucency about the left upper second molar. Orbits: No acute orbital fracture.  No globe injury. Sinuses: No sinus fracture or hemosinus. Mucous retention cyst in the left maxillary sinus. Soft tissues: 11 mm foreign body in the midline in the lower lip. Adjacent soft tissue thickening. Limited intracranial: Assessed on concurrent head CT, reported separately. IMPRESSION: 1. No acute facial bone fracture. 2. A 11 mm foreign body in the midline in the lower lip. Adjacent soft tissue thickening. 3. Periapical lucency about the left upper second molar. Recommend dental assessment. Electronically Signed   By: Narda Rutherford M.D.   On: 11/21/2023 17:49   DG Chest Portable 1 View Result Date: 11/21/2023 CLINICAL DATA:  Headache, blurry vision and syncope. EXAM: PORTABLE CHEST 1 VIEW COMPARISON:  January 03, 2022 FINDINGS: The heart size and mediastinal contours are within normal limits. Both lungs are clear. The visualized skeletal structures are unremarkable. IMPRESSION: No active disease. Electronically Signed   By: Aram Candela M.D.   On: 11/21/2023 17:24    Procedures .Laceration Repair  Date/Time: 11/21/2023 8:47 PM  Performed by: Fayrene Helper, PA-C Authorized by: Fayrene Helper, PA-C   Consent:    Consent obtained:  Verbal   Consent given by:  Patient   Risks, benefits, and alternatives were discussed: yes     Risks discussed:  Infection and poor wound healing   Alternatives discussed:  No treatment Universal protocol:    Procedure explained and questions answered to patient or proxy's satisfaction: yes     Relevant documents present and verified: yes     Patient identity confirmed:  Verbally with patient and arm band Anesthesia:    Anesthesia method:  Local infiltration   Local anesthetic:  Lidocaine 2%  WITH epi Laceration details:    Location:  Lip   Lip location:  Upper interior lip   Length (cm):  3   Depth (mm):  7 Pre-procedure details:    Preparation:  Patient was prepped and draped in usual sterile fashion Exploration:    Limited defect created (wound extended): no     Hemostasis achieved with:  Epinephrine and direct pressure   Imaging outcome: foreign body noted     Wound exploration: wound explored through full range of motion and entire depth of wound visualized     Wound extent: foreign bodies/material and muscle damage     Wound extent: no underlying fracture     Foreign bodies/material:  Tooth fragment   Contaminated: no   Treatment:    Area cleansed with:  Saline   Amount of cleaning:  Standard   Irrigation solution:  Sterile saline   Irrigation method:  Pressure wash   Debridement:  None   Undermining:  None   Scar revision: no   Skin repair:    Repair method:  Sutures   Suture size:  5-0   Suture material:  Fast-absorbing gut   Suture technique:  Simple interrupted   Number of sutures:  5 Approximation:    Approximation:  Close   Vermilion border well-aligned: yes   Repair type:    Repair type:  Intermediate Post-procedure details:    Dressing:  Open (no dressing)   Procedure completion:  Tolerated well, no immediate complications     Medications Ordered in ED Medications  Tdap (BOOSTRIX) injection 0.5 mL (0.5 mLs Intramuscular Given 11/21/23 1951)  oxyCODONE-acetaminophen (PERCOCET/ROXICET) 5-325 MG per tablet 1 tablet (1 tablet Oral Given 11/21/23 1951)  lidocaine-EPINEPHrine (XYLOCAINE W/EPI) 2 %-1:200000 (PF) injection 10 mL (10 mLs Intradermal Given by Other 11/21/23 1951)    ED Course/ Medical Decision Making/ A&P                                 Medical Decision Making Amount and/or Complexity of Data Reviewed Labs: ordered.  Risk Prescription drug management.   BP (!) 144/96   Pulse 85   Temp 98.7 F (37.1 C)   Resp 18   Ht 6\' 1"   (1.854 m)   Wt 95 kg   SpO2 100%   BMI 27.63 kg/m   27:30 PM  38 year old male presenting for patient recent syncopal episode.  Patient report he was donating plasma earlier today and after he finished with the session, he went out and went home however he report afterward he felt hot flash, feeling very weak and shaky and as he was walking out he had a syncopal episode fell forward striking his face against the ground.  He is not complaining of pain to his mouth and states that he cut his lower lip in the process.  He denies significant neck pain denies any other injury.  He is unsure his last tetanus status.  he is requesting for pain medication.  Exam notable for deep laceration involving the lower lip and the mucosal region without any through and through penetration and it does not cross the vermilion border.  A fragment of his tooth was noted in the wound.  He does have a dental fracture involving his right upper central incisor.  No other injuries noted.  Patient is mentating appropriately.  Suspect syncopal episode likely due to recent plasma donation.  -Labs ordered, independently viewed and interpreted by me.  Labs remarkable for Hgb 19.  Covid, flu, rsv negative.  -The patient was maintained on a cardiac monitor.  I personally viewed and interpreted the cardiac monitored which showed an underlying rhythm of: NSR -Imaging independently viewed and interpreted by me and I agree with radiologist's interpretation.  Result remarkable for CT head/cspine/maxillofacial showing no acute fx.  11mm fb noted in lower lip.  This is from the fractured tooth -This patient presents to the ED for concern of syncope, this involves an extensive number of treatment options, and is a complaint that carries with it a high risk of complications and morbidity.  The differential diagnosis includes vasovagal syncope, cardiac arrhythmia, anemia, hypovolemia, stroke, MI -Co morbidities that complicate the patient  evaluation includes none -Treatment includes percocet, tdap, suture  repair -Reevaluation of the patient after these medicines showed that the patient improved -PCP office notes or outside notes reviewed -Discussion with attending Dr. Elayne Snare -Escalation to admission/observation considered: patients feels much better, is comfortable with discharge, and will follow up with PCP -Prescription medication considered, patient comfortable with ibuprofen -Social Determinant of Health considered which includes tobacco use          Final Clinical Impression(s) / ED Diagnoses Final diagnoses:  Syncope, unspecified syncope type  Closed fracture of tooth, initial encounter  Lip laceration, initial encounter    Rx / DC Orders ED Discharge Orders          Ordered    ibuprofen (ADVIL) 600 MG tablet  Every 6 hours PRN        11/21/23 2050              Fayrene Helper, PA-C 11/21/23 2052    Royanne Foots, DO 11/27/23 [phone]

## 2023-11-22 ENCOUNTER — Ambulatory Visit: Payer: Self-pay | Admitting: General Practice

## 2023-11-22 NOTE — Telephone Encounter (Signed)
Chief Complaint: migraines Symptoms: moderate dizziness "feels like I'm floating", migraine headache, peripheral vision loss (he states occurs with his migraines. Frequency: ongoing, recurrent and constant Pertinent Negatives: Patient denies neck pain, back pain, weakness or numbness unilateral, Disposition: [] ED /[x] Urgent Care (no appt availability in office) / [] Appointment(In office/virtual)/ []  [facility]/ [] Home Care/ [] Refused Recommended Disposition /[] [facility]/ []  Follow-up with PCP Additional Notes: Patient states he took 2 Tylenol and states it did not help with pain. No available acute patient appointments. Recommended urgent care of ED for acute needs today and patient is agreeable. Patient states he is now on insurance and would like to get established as a new patient. New patient appointment scheduled for patient.   Copied from CRM [id number]. Topic: Appointments - Appointment Scheduling >> Nov 22, 2023  1:32 PM Marlow Baars wrote: Jamal Collin the patients advocate called in very worried about the aptient. She states he was recently in the hospital due to severe migraines and black outs. She states he still continues to complain of a bad headache along with vision problems and difficulty walking with an unsteady gait. She said the patient had head trauma at a very young age and has had complications from that. I will transfer patient to the E2C2 NT due to red word symptoms Reason for Disposition  [1] SEVERE headache (e.g., excruciating) AND [2] not improved after 2 hours of pain medicine  Answer Assessment - Initial Assessment Questions 1. LOCATION: "Where does it hurt?"      Front and sometimes right side.  2. ONSET: "When did the headache start?" (Minutes, hours or days)      Patient states he has been having them all his life; he states the one today has been going on since he woke up at 0930AM.  3. PATTERN: "Does the pain come and go, or has it been  constant since it started?"     Constant x 4 hours.   4. SEVERITY: "How bad is the pain?" and "What does it keep you from doing?"  (e.g., Scale 1-10; mild, moderate, or severe)   - MILD (1-3): doesn't interfere with normal activities    - MODERATE (4-7): interferes with normal activities or awakens from sleep    - SEVERE (8-10): excruciating pain, unable to do any normal activities        8/10.  5. RECURRENT SYMPTOM: "Have you ever had headaches before?" If Yes, ask: "When was the last time?" and "What happened that time?"      Patient states he has been having them recurring since he was little. He states the migraines only go away when he sleeps.  6. CAUSE: "What do you think is causing the headache?"     Patient advocate states he has not been to a neurologist to tell them what is causing the migraines.  7. MIGRAINE: "Have you been diagnosed with migraine headaches?" If Yes, ask: "Is this headache similar?"      Yes patient states he has been diagnosed with migraines and this feels similar.  8. HEAD INJURY: "Has there been any recent injury to the head?"      Fall yesterday, unsure if he hit his head on the ground. Patient was seen in ED for it. Multiple falls within the past year.  9. OTHER SYMPTOMS: "Do you have any other symptoms?" (fever, stiff neck, eye pain, sore throat, cold symptoms)     "Cramps in the chest area", dizziness.  Protocols used: [facility]

## 2023-11-25 DIAGNOSIS — Z419 Encounter for procedure for purposes other than remedying health state, unspecified: Secondary | ICD-10-CM | POA: Diagnosis not present

## 2023-11-29 ENCOUNTER — Ambulatory Visit (INDEPENDENT_AMBULATORY_CARE_PROVIDER_SITE_OTHER): Payer: 59 | Admitting: Emergency Medicine

## 2023-11-29 ENCOUNTER — Encounter: Payer: Self-pay | Admitting: Emergency Medicine

## 2023-11-29 VITALS — BP 142/98 | HR 81 | Temp 98.7°F | Ht 72.0 in | Wt 190.0 lb

## 2023-11-29 DIAGNOSIS — R5383 Other fatigue: Secondary | ICD-10-CM

## 2023-11-29 DIAGNOSIS — Z13220 Encounter for screening for lipoid disorders: Secondary | ICD-10-CM | POA: Diagnosis not present

## 2023-11-29 DIAGNOSIS — R29818 Other symptoms and signs involving the nervous system: Secondary | ICD-10-CM | POA: Diagnosis not present

## 2023-11-29 DIAGNOSIS — Z130 Encounter for screening for diseases of the blood and blood-forming organs and certain disorders involving the immune mechanism: Secondary | ICD-10-CM

## 2023-11-29 DIAGNOSIS — Z1329 Encounter for screening for other suspected endocrine disorder: Secondary | ICD-10-CM

## 2023-11-29 DIAGNOSIS — Z7689 Persons encountering health services in other specified circumstances: Secondary | ICD-10-CM

## 2023-11-29 DIAGNOSIS — Z13228 Encounter for screening for other metabolic disorders: Secondary | ICD-10-CM | POA: Diagnosis not present

## 2023-11-29 DIAGNOSIS — Z0000 Encounter for general adult medical examination without abnormal findings: Secondary | ICD-10-CM | POA: Diagnosis not present

## 2023-11-29 DIAGNOSIS — Z0001 Encounter for general adult medical examination with abnormal findings: Secondary | ICD-10-CM

## 2023-11-29 DIAGNOSIS — F17200 Nicotine dependence, unspecified, uncomplicated: Secondary | ICD-10-CM | POA: Diagnosis not present

## 2023-11-29 LAB — TSH: TSH: 3.02 u[IU]/mL (ref 0.35–5.50)

## 2023-11-29 LAB — LIPID PANEL
Cholesterol: 159 mg/dL (ref 0–200)
HDL: 66.3 mg/dL (ref >39.00)
LDL Cholesterol: 76 mg/dL (ref 0–99)
NonHDL: 93
Total CHOL/HDL Ratio: 2
Triglycerides: 86 mg/dL (ref 0.0–149.0)
VLDL: 17.2 mg/dL (ref 0.0–40.0)

## 2023-11-29 LAB — COMPREHENSIVE METABOLIC PANEL
ALT: 35 U/L (ref 0–53)
AST: 36 U/L (ref 0–37)
Albumin: 4.2 g/dL (ref 3.5–5.2)
Alkaline Phosphatase: 53 U/L (ref 39–117)
BUN: 12 mg/dL (ref 6–23)
CO2: 26 meq/L (ref 19–32)
Calcium: 9 mg/dL (ref 8.4–10.5)
Chloride: 103 meq/L (ref 96–112)
Creatinine, Ser: 0.92 mg/dL (ref 0.40–1.50)
GFR: 105.75 mL/min (ref >60.00)
Glucose, Bld: 83 mg/dL (ref 70–99)
Potassium: 4.3 meq/L (ref 3.5–5.1)
Sodium: 137 meq/L (ref 135–145)
Total Bilirubin: 0.5 mg/dL (ref 0.2–1.2)
Total Protein: 6.4 g/dL (ref 6.0–8.3)

## 2023-11-29 LAB — CBC WITH DIFFERENTIAL/PLATELET
Basophils Absolute: 0 10*3/uL (ref 0.0–0.1)
Basophils Relative: 0.7 % (ref 0.0–3.0)
Eosinophils Absolute: 0 10*3/uL (ref 0.0–0.7)
Eosinophils Relative: 0.6 % (ref 0.0–5.0)
HCT: 47.9 % (ref 39.0–52.0)
Hemoglobin: 16.4 g/dL (ref 13.0–17.0)
Lymphocytes Relative: 28.6 % (ref 12.0–46.0)
Lymphs Abs: 2 10*3/uL (ref 0.7–4.0)
MCHC: 34.3 g/dL (ref 30.0–36.0)
MCV: 99.1 fL (ref 78.0–100.0)
Monocytes Absolute: 0.8 10*3/uL (ref 0.1–1.0)
Monocytes Relative: 11.1 % (ref 3.0–12.0)
Neutro Abs: 4.2 10*3/uL (ref 1.4–7.7)
Neutrophils Relative %: 59 % (ref 43.0–77.0)
Platelets: 226 10*3/uL (ref 150.0–400.0)
RBC: 4.83 Mil/uL (ref 4.22–5.81)
RDW: 12.9 % (ref 11.5–15.5)
WBC: 7.1 10*3/uL (ref 4.0–10.5)

## 2023-11-29 LAB — HEMOGLOBIN A1C: Hgb A1c MFr Bld: 5.5 % (ref 4.6–6.5)

## 2023-11-29 LAB — VITAMIN D 25 HYDROXY (VIT D DEFICIENCY, FRACTURES): VITD: <7 ng/mL — ABNORMAL LOW (ref 30.00–100.00)

## 2023-11-29 LAB — VITAMIN B12: Vitamin B-12: 305 pg/mL (ref 211–911)

## 2023-11-29 NOTE — Patient Instructions (Signed)
 Health Maintenance, Male  Adopting a healthy lifestyle and getting preventive care are important in promoting health and wellness. Ask your health care provider about:  The right schedule for you to have regular tests and exams.  Things you can do on your own to prevent diseases and keep yourself healthy.  What should I know about diet, weight, and exercise?  Eat a healthy diet    Eat a diet that includes plenty of vegetables, fruits, low-fat dairy products, and lean protein.  Do not eat a lot of foods that are high in solid fats, added sugars, or sodium.  Maintain a healthy weight  Body mass index (BMI) is a measurement that can be used to identify possible weight problems. It estimates body fat based on height and weight. Your health care provider can help determine your BMI and help you achieve or maintain a healthy weight.  Get regular exercise  Get regular exercise. This is one of the most important things you can do for your health. Most adults should:  Exercise for at least 150 minutes each week. The exercise should increase your heart rate and make you sweat (moderate-intensity exercise).  Do strengthening exercises at least twice a week. This is in addition to the moderate-intensity exercise.  Spend less time sitting. Even light physical activity can be beneficial.  Watch cholesterol and blood lipids  Have your blood tested for lipids and cholesterol at 39 years of age, then have this test every 5 years.  You may need to have your cholesterol levels checked more often if:  Your lipid or cholesterol levels are high.  You are older than 39 years of age.  You are at high risk for heart disease.  What should I know about cancer screening?  Many types of cancers can be detected early and may often be prevented. Depending on your health history and family history, you may need to have cancer screening at various ages. This may include screening for:  Colorectal cancer.  Prostate cancer.  Skin cancer.  Lung  cancer.  What should I know about heart disease, diabetes, and high blood pressure?  Blood pressure and heart disease  High blood pressure causes heart disease and increases the risk of stroke. This is more likely to develop in people who have high blood pressure readings or are overweight.  Talk with your health care provider about your target blood pressure readings.  Have your blood pressure checked:  Every 3-5 years if you are 24-52 years of age.  Every year if you are 3 years old or older.  If you are between the ages of 60 and 72 and are a current or former smoker, ask your health care provider if you should have a one-time screening for abdominal aortic aneurysm (AAA).  Diabetes  Have regular diabetes screenings. This checks your fasting blood sugar level. Have the screening done:  Once every three years after age 66 if you are at a normal weight and have a low risk for diabetes.  More often and at a younger age if you are overweight or have a high risk for diabetes.  What should I know about preventing infection?  Hepatitis B  If you have a higher risk for hepatitis B, you should be screened for this virus. Talk with your health care provider to find out if you are at risk for hepatitis B infection.  Hepatitis C  Blood testing is recommended for:  Everyone born from [birth year] through 1966.  Anyone  with known risk factors for hepatitis C.  Sexually transmitted infections (STIs)  You should be screened each year for STIs, including gonorrhea and chlamydia, if:  You are sexually active and are younger than 39 years of age.  You are older than 39 years of age and your health care provider tells you that you are at risk for this type of infection.  Your sexual activity has changed since you were last screened, and you are at increased risk for chlamydia or gonorrhea. Ask your health care provider if you are at risk.  Ask your health care provider about whether you are at high risk for HIV. Your health care provider  may recommend a prescription medicine to help prevent HIV infection. If you choose to take medicine to prevent HIV, you should first get tested for HIV. You should then be tested every 3 months for as long as you are taking the medicine.  Follow these instructions at home:  Alcohol use  Do not drink alcohol if your health care provider tells you not to drink.  If you drink alcohol:  Limit how much you have to 0-2 drinks a day.  Know how much alcohol is in your drink. In the [place] one drink equals one 12 oz bottle of beer (355 mL), one 5 oz glass of wine (148 mL), or one 1 oz glass of hard liquor (44 mL).  Lifestyle  Do not use any products that contain nicotine or tobacco. These products include cigarettes, chewing tobacco, and vaping devices, such as e-cigarettes. If you need help quitting, ask your health care provider.  Do not use street drugs.  Do not share needles.  Ask your health care provider for help if you need support or information about quitting drugs.  General instructions  Schedule regular health, dental, and eye exams.  Stay current with your vaccines.  Tell your health care provider if:  You often feel depressed.  You have ever been abused or do not feel safe at home.  Summary  Adopting a healthy lifestyle and getting preventive care are important in promoting health and wellness.  Follow your health care provider's instructions about healthy diet, exercising, and getting tested or screened for diseases.  Follow your health care provider's instructions on monitoring your cholesterol and blood pressure.  This information is not intended to replace advice given to you by your health care provider. Make sure you discuss any questions you have with your health care provider.  Document Revised: 03/01/2021 Document Reviewed: 03/01/2021  Elsevier Patient Education  2024 [employer].

## 2023-11-29 NOTE — Assessment & Plan Note (Addendum)
 Multifactorial.  Differential diagnosis discussed Suspect obstructive sleep apnea Blood work done today Advised to quit smoking and stay well-hydrated Not getting quality sleep Recommend sleep studies

## 2023-11-29 NOTE — Assessment & Plan Note (Signed)
 Cardiovascular and cancer risks associated with smoking discussed. Smoking cessation advice given.

## 2023-11-29 NOTE — Progress Notes (Signed)
 Benjamin Vargas 38 y.o.   Chief Complaint  Patient presents with   Establish Care    Patient here to establish care he has not seen a provider before. Patient mentions wanting his lip looked at, he has stiches in but he states he felt it pop last night. Has a recent ED visit and states he wasn't told anything about CT, EKG, and Xray results. Pt thinks he has sickle cell and wants to get tested     HISTORY OF PRESENT ILLNESS: This is a 38 y.o. male first visit to this office, here to establish care with me. Was recently in the emergency room after sustaining a fall. Imaging reports reviewed with patient.  Blood work results were reviewed with patient. Daily smoker. Has no chronic medical conditions Concerned about possible sleep apnea.  Here with his wife.  Patient snores and has had apnea episodes in his sleep.  Has daytime somnolence and general tiredness.  No other complaints or medical concerns today.  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   THROMBOCYTOPENIA 05/15/2008   UNSPECIFIED VISUAL LOSS 04/29/2008   HEADACHE 04/29/2008    Past Medical History:  Diagnosis Date   Migraines     Past Surgical History:  Procedure Laterality Date   skull fracture repair      Social History   Socioeconomic History   Marital status: Single    Spouse name: Not on file   Number of children: 2   Years of education: Not on file   Highest education level: Associate degree: occupational, scientist, product/process development, or vocational program  Occupational History   Not on file  Tobacco Use   Smoking status: Every Day    Current packs/day: 0.50    Types: Cigarettes   Smokeless tobacco: Never  Vaping Use   Vaping status: Never Used  Substance and Sexual Activity   Alcohol use: Yes    Alcohol/week: 2.0 standard drinks of alcohol    Types: 2 Cans of beer per week    Comment: everyday    Drug use: Yes    Frequency: 1.0 times per week    Types: Marijuana   Sexual activity: Yes    Birth control/protection: None  Other Topics Concern   Not on file  Social History Narrative   Not on file   Social Drivers of Health   Financial Resource Strain: Not on file  Food Insecurity: Not on file  Transportation Needs: Not on file  Physical Activity: Not on file  Stress: Not on file  Social Connections: Not on file  Intimate Partner Violence: Not on file    Family History  Problem Relation Age of Onset   Cancer Brother    Asthma Daughter    Asthma Son      Review of Systems  Constitutional:  Positive for malaise/fatigue. Negative for chills and fever.  HENT: Negative.  Negative for congestion and sore throat.   Respiratory: Negative.  Negative for cough and shortness of breath.   Cardiovascular: Negative.  Negative for chest pain and palpitations.  Gastrointestinal:  Negative for abdominal pain, diarrhea, nausea and vomiting.  Genitourinary: Negative.  Negative for dysuria and hematuria.  Skin: Negative.  Negative for rash.  Neurological: Negative.  Negative for dizziness and headaches.  All other systems reviewed and are negative.   Vitals:   11/29/23 1452  BP: (!) 142/98  Pulse: 81  Temp: 98.7 F (37.1 C)  SpO2: 95%    Physical Exam Vitals reviewed.  Constitutional:      Appearance: Normal appearance.  HENT:     Head: Normocephalic.     Right Ear: Tympanic membrane, ear canal and external ear normal.     Left Ear: Tympanic membrane, ear canal and external ear normal.     Mouth/Throat:     Mouth: Mucous membranes are moist.     Pharynx: Oropharynx is clear.     Comments: Recent lower lip laceration.  Healing well.  No infection. Eyes:     Extraocular Movements: Extraocular movements intact.     Conjunctiva/sclera: Conjunctivae normal.     Pupils: Pupils are equal, round, and reactive to light.  Cardiovascular:     Rate and Rhythm: Normal rate and  regular rhythm.     Pulses: Normal pulses.     Heart sounds: Normal heart sounds.  Pulmonary:     Effort: Pulmonary effort is normal.     Breath sounds: Normal breath sounds.  Abdominal:     Palpations: Abdomen is soft.     Tenderness: There is no abdominal tenderness.  Musculoskeletal:     Cervical back: No tenderness.     Right lower leg: No edema.     Left lower leg: No edema.  Lymphadenopathy:     Cervical: No cervical adenopathy.  Skin:    General: Skin is warm and dry.     Capillary Refill: Capillary refill takes less than 2 seconds.  Neurological:     General: No focal deficit present.     Mental Status: He is alert and oriented to person, place, and time.  Psychiatric:        Mood and Affect: Mood normal.        Behavior: Behavior normal.      ASSESSMENT & PLAN: Problem List Items Addressed This Visit       Other   Suspected sleep apnea   Wakes up tired, snores, apnea episodes, daytime somnolence, and general malaise Highly suspicious for obstructive sleep apnea Needs sleep studies Referral placed today      Relevant Orders   Ambulatory referral to Sleep Studies   Comprehensive metabolic panel   CBC with Differential/Platelet   Current smoker   Cardiovascular and cancer risks associated with smoking discussed Smoking cessation advice given      Relevant Orders   CBC with Differential/Platelet   Tiredness   Multifactorial.  Differential diagnosis discussed Suspect obstructive sleep apnea Blood work done today Advised to quit smoking and stay well-hydrated Not getting quality sleep Recommend sleep studies      Relevant Orders   Comprehensive metabolic panel   CBC with Differential/Platelet   Other Visit Diagnoses       Encounter for general adult medical examination with abnormal findings    -  Primary   Relevant Orders   Comprehensive metabolic panel   CBC with Differential/Platelet   Hemoglobin A1c   Lipid panel   TSH   VITAMIN D  25  Hydroxy (Vit-D Deficiency, Fractures)   Vitamin B12     Encounter to establish care         Screening for deficiency anemia       Relevant Orders   CBC with Differential/Platelet     Screening for lipoid disorders       Relevant Orders   Lipid panel     Screening for endocrine, metabolic and  immunity disorder       Relevant Orders   Comprehensive metabolic panel   Hemoglobin A1c   TSH   VITAMIN D  25 Hydroxy (Vit-D Deficiency, Fractures)   Vitamin B12      Modifiable risk factors discussed with patient. Anticipatory guidance according to age provided. The following topics were also discussed: Social Determinants of Health Smoking and cardiovascular/cancer risk associated with this condition.  Smoking cessation advised Diet and nutrition Benefits of exercise Cancer family history review Vaccinations review and recommendations Cardiovascular risk assessment and need for blood work Suspected obstructive sleep apnea and need for sleep studies Review of most recent emergency department visit Review of recent imaging reports including CT scan of head, face, and neck Mental health including depression and anxiety Fall and accident prevention  Patient Instructions  Health Maintenance, Male Adopting a healthy lifestyle and getting preventive care are important in promoting health and wellness. Ask your health care provider about: The right schedule for you to have regular tests and exams. Things you can do on your own to prevent diseases and keep yourself healthy. What should I know about diet, weight, and exercise? Eat a healthy diet  Eat a diet that includes plenty of vegetables, fruits, low-fat dairy products, and lean protein. Do not eat a lot of foods that are high in solid fats, added sugars, or sodium. Maintain a healthy weight Body mass index (BMI) is a measurement that can be used to identify possible weight problems. It estimates body fat based on height and weight. Your  health care provider can help determine your BMI and help you achieve or maintain a healthy weight. Get regular exercise Get regular exercise. This is one of the most important things you can do for your health. Most adults should: Exercise for at least 150 minutes each week. The exercise should increase your heart rate and make you sweat (moderate-intensity exercise). Do strengthening exercises at least twice a week. This is in addition to the moderate-intensity exercise. Spend less time sitting. Even light physical activity can be beneficial. Watch cholesterol and blood lipids Have your blood tested for lipids and cholesterol at 39 years of age, then have this test every 5 years. You may need to have your cholesterol levels checked more often if: Your lipid or cholesterol levels are high. You are older than 39 years of age. You are at high risk for heart disease. What should I know about cancer screening? Many types of cancers can be detected early and may often be prevented. Depending on your health history and family history, you may need to have cancer screening at various ages. This may include screening for: Colorectal cancer. Prostate cancer. Skin cancer. Lung cancer. What should I know about heart disease, diabetes, and high blood pressure? Blood pressure and heart disease High blood pressure causes heart disease and increases the risk of stroke. This is more likely to develop in people who have high blood pressure readings or are overweight. Talk with your health care provider about your target blood pressure readings. Have your blood pressure checked: Every 3-5 years if you are 68-42 years of age. Every year if you are 27 years old or older. If you are between the ages of 65 and 41 and are a current or former smoker, ask your health care provider if you should have a one-time screening for abdominal aortic aneurysm (AAA). Diabetes Have regular diabetes screenings. This checks  your fasting blood sugar level. Have the screening done: Once every  three years after age 75 if you are at a normal weight and have a low risk for diabetes. More often and at a younger age if you are overweight or have a high risk for diabetes. What should I know about preventing infection? Hepatitis B If you have a higher risk for hepatitis B, you should be screened for this virus. Talk with your health care provider to find out if you are at risk for hepatitis B infection. Hepatitis C Blood testing is recommended for: Everyone born from [birth year] through 1966. Anyone with known risk factors for hepatitis C. Sexually transmitted infections (STIs) You should be screened each year for STIs, including gonorrhea and chlamydia, if: You are sexually active and are younger than 39 years of age. You are older than 39 years of age and your health care provider tells you that you are at risk for this type of infection. Your sexual activity has changed since you were last screened, and you are at increased risk for chlamydia or gonorrhea. Ask your health care provider if you are at risk. Ask your health care provider about whether you are at high risk for HIV. Your health care provider may recommend a prescription medicine to help prevent HIV infection. If you choose to take medicine to prevent HIV, you should first get tested for HIV. You should then be tested every 3 months for as long as you are taking the medicine. Follow these instructions at home: Alcohol use Do not drink alcohol if your health care provider tells you not to drink. If you drink alcohol: Limit how much you have to 0-2 drinks a day. Know how much alcohol is in your drink. In the [place] one drink equals one 12 oz bottle of beer (355 mL), one 5 oz glass of wine (148 mL), or one 1 oz glass of hard liquor (44 mL). Lifestyle Do not use any products that contain nicotine or tobacco. These products include cigarettes, chewing tobacco, and  vaping devices, such as e-cigarettes. If you need help quitting, ask your health care provider. Do not use street drugs. Do not share needles. Ask your health care provider for help if you need support or information about quitting drugs. General instructions Schedule regular health, dental, and eye exams. Stay current with your vaccines. Tell your health care provider if: You often feel depressed. You have ever been abused or do not feel safe at home. Summary Adopting a healthy lifestyle and getting preventive care are important in promoting health and wellness. Follow your health care provider's instructions about healthy diet, exercising, and getting tested or screened for diseases. Follow your health care provider's instructions on monitoring your cholesterol and blood pressure. This information is not intended to replace advice given to you by your health care provider. Make sure you discuss any questions you have with your health care provider. Document Revised: 03/01/2021 Document Reviewed: 03/01/2021 Elsevier Patient Education  2024 [employer]    Emil Schaumann, MD [facility]

## 2023-11-29 NOTE — Assessment & Plan Note (Signed)
 Wakes up tired, snores, apnea episodes, daytime somnolence, and general malaise Highly suspicious for obstructive sleep apnea Needs sleep studies Referral placed today

## 2023-11-30 ENCOUNTER — Encounter: Payer: Self-pay | Admitting: Emergency Medicine

## 2023-11-30 ENCOUNTER — Other Ambulatory Visit: Payer: Self-pay | Admitting: Emergency Medicine

## 2023-11-30 DIAGNOSIS — E559 Vitamin D deficiency, unspecified: Secondary | ICD-10-CM

## 2023-11-30 MED ORDER — VITAMIN D (ERGOCALCIFEROL) 1.25 MG (50000 UNIT) PO CAPS: 50000.0000 [IU] | ORAL_CAPSULE | ORAL | 1 refills | Status: AC

## 2023-12-23 DIAGNOSIS — Z419 Encounter for procedure for purposes other than remedying health state, unspecified: Secondary | ICD-10-CM | POA: Diagnosis not present

## 2023-12-27 ENCOUNTER — Institutional Professional Consult (permissible substitution): Payer: 59 | Admitting: Neurology

## 2023-12-27 ENCOUNTER — Encounter: Payer: Self-pay | Admitting: Neurology

## 2024-01-10 ENCOUNTER — Encounter: Payer: Self-pay | Admitting: Neurology

## 2024-01-10 ENCOUNTER — Institutional Professional Consult (permissible substitution): Admitting: Neurology

## 2024-01-10 ENCOUNTER — Telehealth: Payer: Self-pay | Admitting: Neurology

## 2024-01-10 NOTE — Telephone Encounter (Signed)
 NS for sleep consult; 2nd NS. Please review.

## 2024-01-12 ENCOUNTER — Encounter: Payer: Self-pay | Admitting: Neurology

## 2024-02-03 DIAGNOSIS — Z419 Encounter for procedure for purposes other than remedying health state, unspecified: Secondary | ICD-10-CM | POA: Diagnosis not present

## 2024-03-04 DIAGNOSIS — Z419 Encounter for procedure for purposes other than remedying health state, unspecified: Secondary | ICD-10-CM | POA: Diagnosis not present

## 2024-04-04 DIAGNOSIS — Z419 Encounter for procedure for purposes other than remedying health state, unspecified: Secondary | ICD-10-CM | POA: Diagnosis not present

## 2024-05-04 DIAGNOSIS — Z419 Encounter for procedure for purposes other than remedying health state, unspecified: Secondary | ICD-10-CM | POA: Diagnosis not present

## 2024-06-04 DIAGNOSIS — Z419 Encounter for procedure for purposes other than remedying health state, unspecified: Secondary | ICD-10-CM | POA: Diagnosis not present

## 2024-07-05 DIAGNOSIS — Z419 Encounter for procedure for purposes other than remedying health state, unspecified: Secondary | ICD-10-CM | POA: Diagnosis not present
